# Patient Record
Sex: Male | Born: 1955 | Race: Black or African American | Hispanic: No | Marital: Single | Smoking: Current every day smoker
Health system: Southern US, Community
[De-identification: ages and names within clinical notes are randomized; demographics above are authoritative.]

## PROBLEM LIST (undated history)

## (undated) DIAGNOSIS — I10 Essential (primary) hypertension: Secondary | ICD-10-CM

## (undated) DIAGNOSIS — R569 Unspecified convulsions: Secondary | ICD-10-CM

## (undated) DIAGNOSIS — E119 Type 2 diabetes mellitus without complications: Secondary | ICD-10-CM

## (undated) HISTORY — PX: OTHER SURGICAL HISTORY: SHX169

---

## 2005-02-12 ENCOUNTER — Emergency Department: Payer: Self-pay | Admitting: General Practice

## 2005-06-20 ENCOUNTER — Other Ambulatory Visit: Payer: Self-pay

## 2005-06-20 ENCOUNTER — Emergency Department: Payer: Self-pay | Admitting: Emergency Medicine

## 2006-08-14 ENCOUNTER — Emergency Department: Payer: Self-pay

## 2006-08-14 ENCOUNTER — Other Ambulatory Visit: Payer: Self-pay

## 2007-01-18 ENCOUNTER — Other Ambulatory Visit: Payer: Self-pay

## 2007-01-18 ENCOUNTER — Emergency Department: Payer: Self-pay | Admitting: Emergency Medicine

## 2007-03-06 ENCOUNTER — Emergency Department: Payer: Self-pay | Admitting: Emergency Medicine

## 2007-03-10 ENCOUNTER — Ambulatory Visit: Payer: Self-pay | Admitting: Internal Medicine

## 2007-05-07 ENCOUNTER — Ambulatory Visit: Payer: Self-pay | Admitting: Psychiatry

## 2008-03-14 ENCOUNTER — Emergency Department: Payer: Self-pay | Admitting: Unknown Physician Specialty

## 2008-03-14 ENCOUNTER — Other Ambulatory Visit: Payer: Self-pay

## 2008-06-18 ENCOUNTER — Emergency Department: Payer: Self-pay | Admitting: Emergency Medicine

## 2008-06-18 ENCOUNTER — Other Ambulatory Visit: Payer: Self-pay

## 2009-08-19 ENCOUNTER — Emergency Department: Payer: Self-pay | Admitting: Emergency Medicine

## 2010-04-27 IMAGING — CT CT HEAD WITHOUT CONTRAST
2 series · 16 of 30 positions shown, 20 images · non-contrast
Comparison: none

REASON FOR EXAM: confusion
COMMENTS:

[Series 2: without · axial · non-contrast · 0.40mm/px · z∈[+710,+840]mm · 13 of 32 slices shown, 17 images]
[im 3/32  brain]
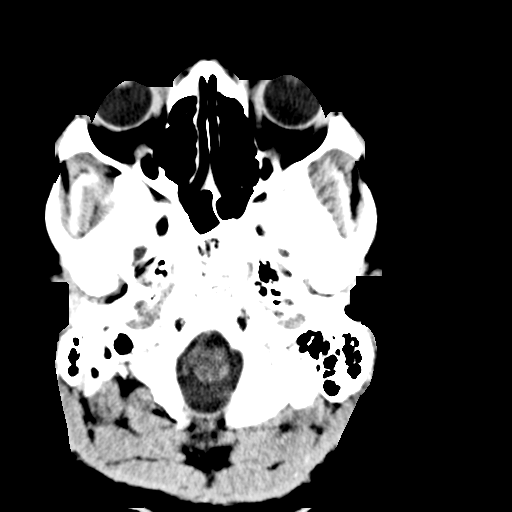
[im 3/32  bone]
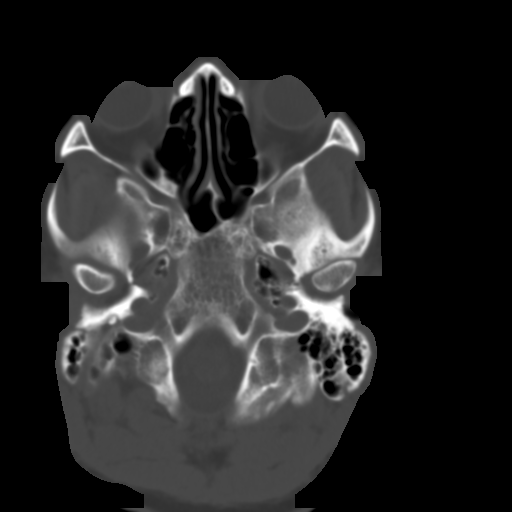
[im 5/32  brain]
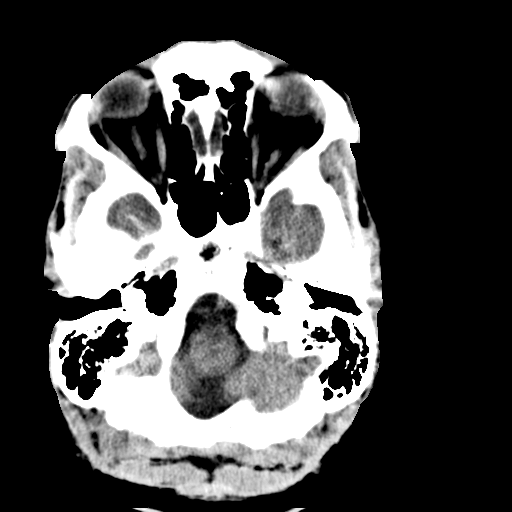
[im 7/32  brain]
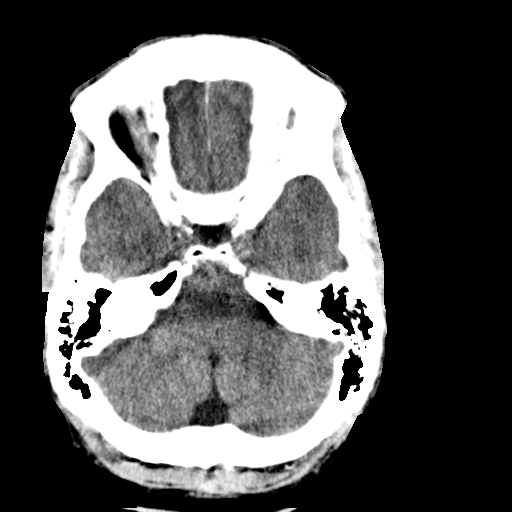
[im 9/32  brain]
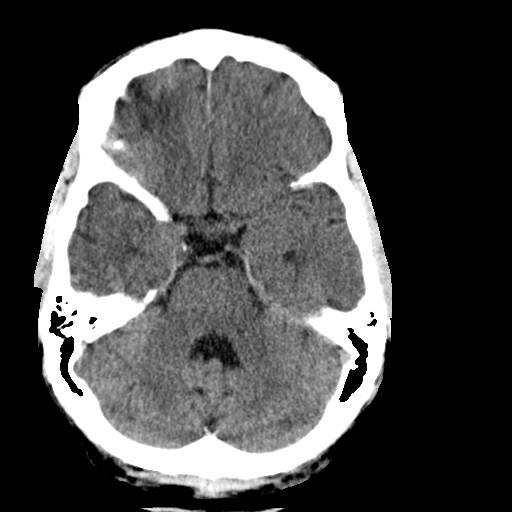
[im 12/32  brain]
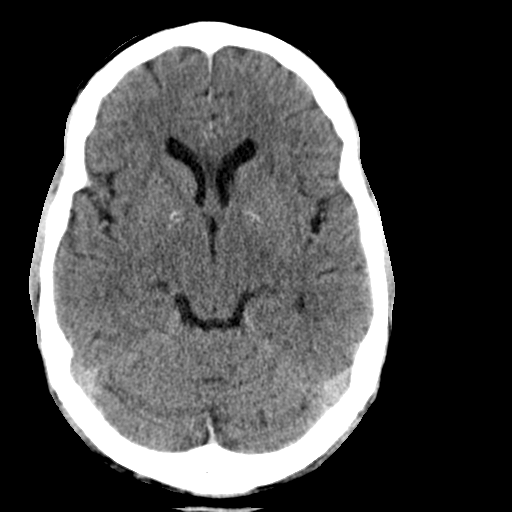
[im 12/32  bone]
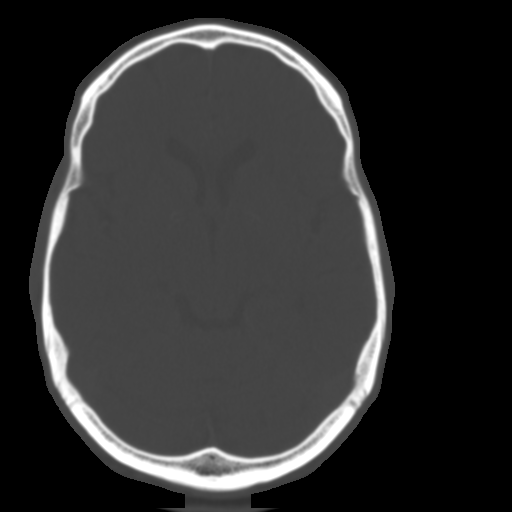
[im 14/32  brain]
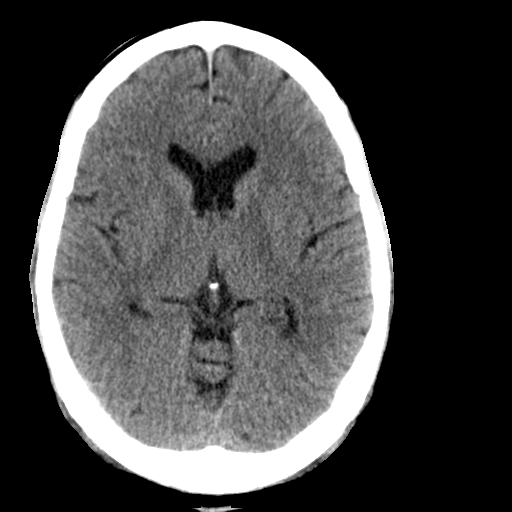
[im 16/32  brain]
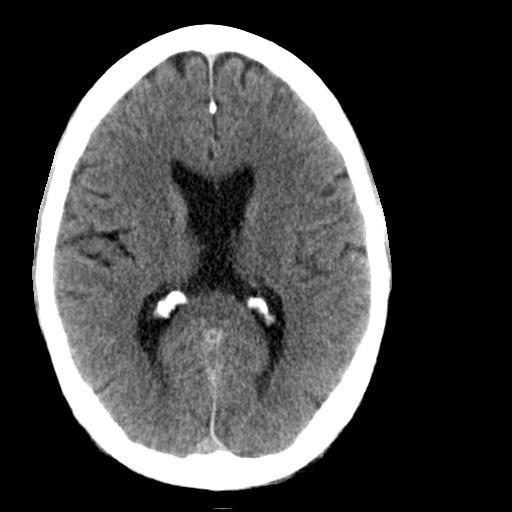
[im 18/32  brain]
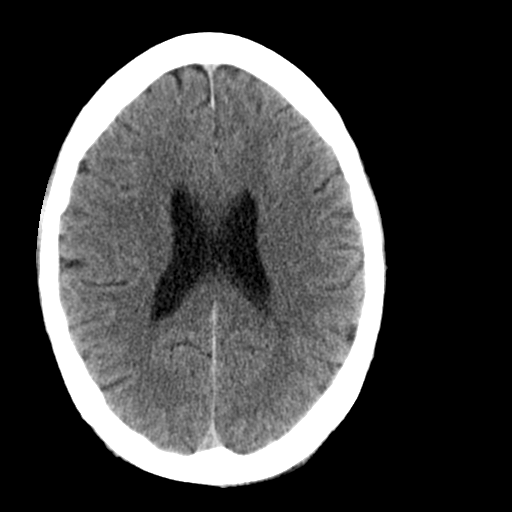
[im 20/32  brain]
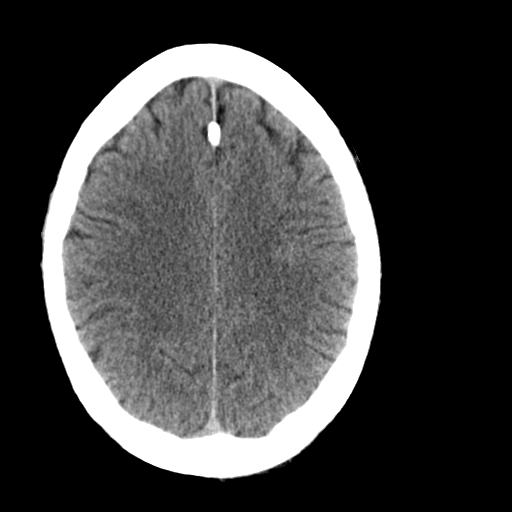
[im 20/32  bone]
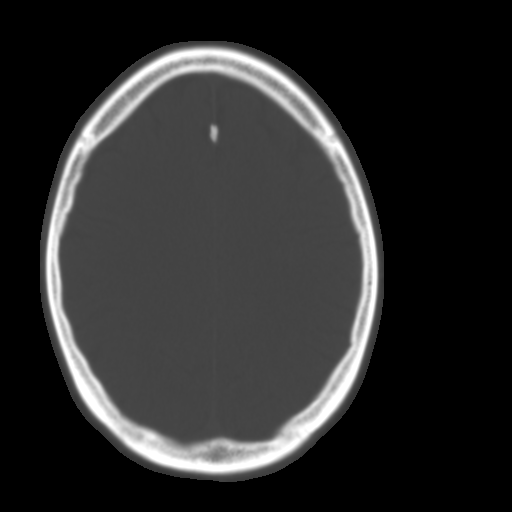
[im 23/32  brain]
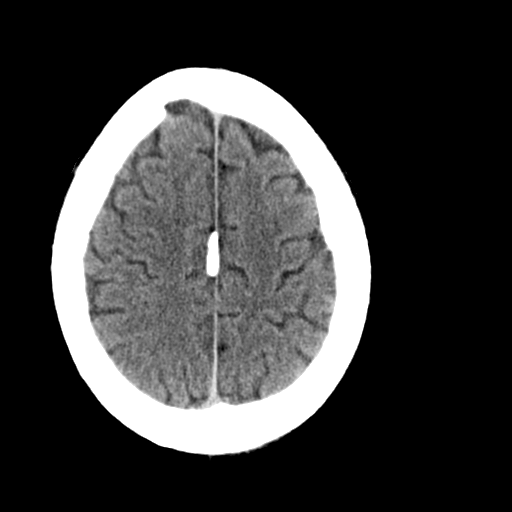
[im 25/32  brain]
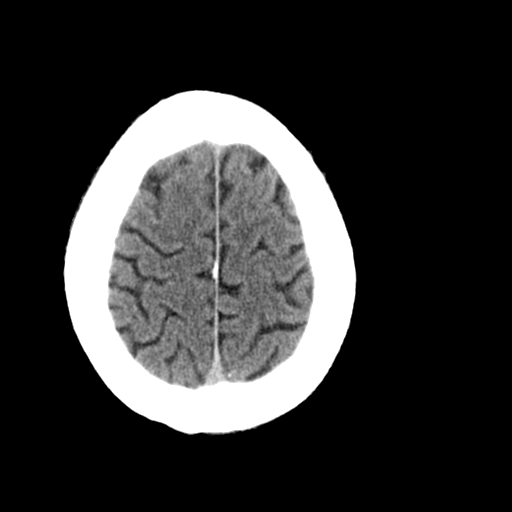
[im 27/32  brain]
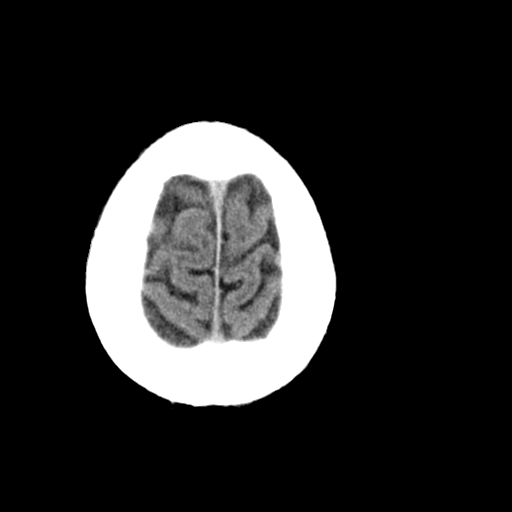
[im 29/32  brain]
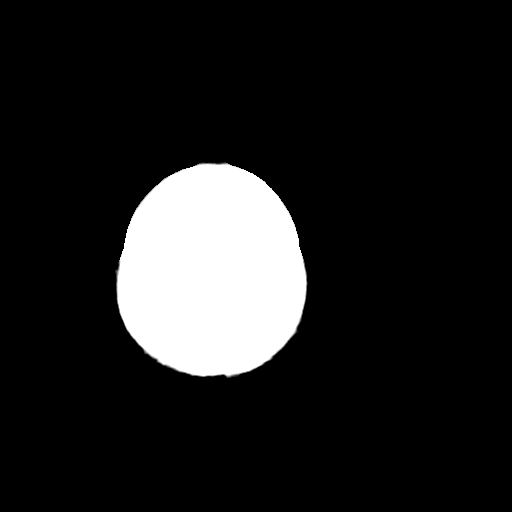
[im 29/32  bone]
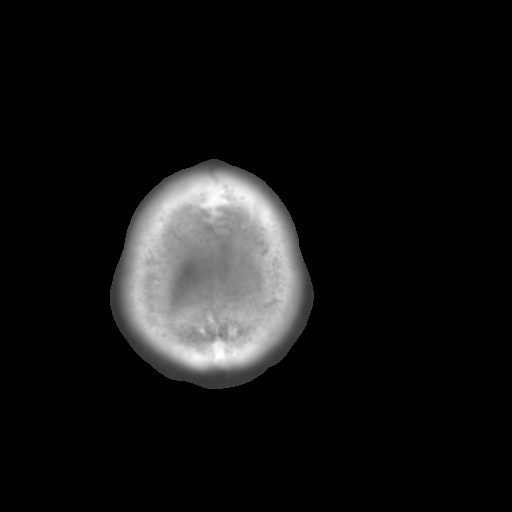

[Series 3: bone · axial · 0.40mm/px · z∈[+710,+756]mm · 3 of 32 slices shown]
[im 3/32  bone]
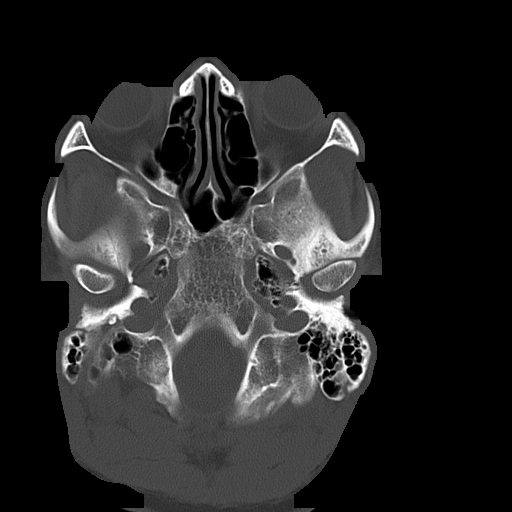
[im 7/32  bone]
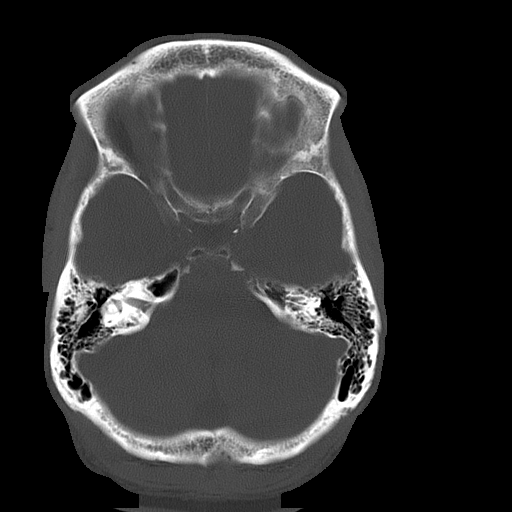
[im 12/32  bone]
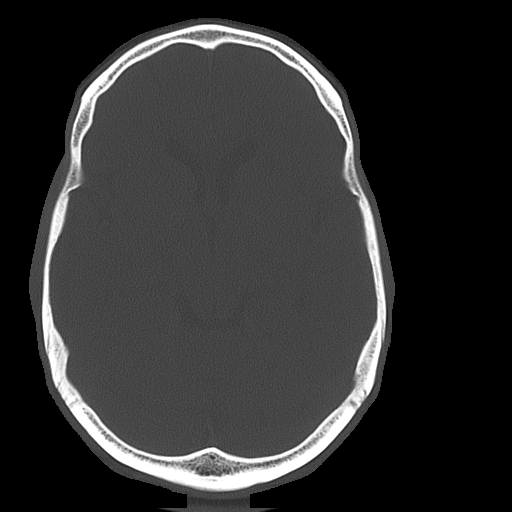

[16 of 30 positions shown; findings below may reference images not displayed]

PROCEDURE:     CT  - CT HEAD WITHOUT CONTRAST  - August 19, 2009  [DATE]

RESULT:     Axial noncontrast CT scanning was performed through the brain at
5 mm intervals and slice thicknesses. Comparison made to study 14 August, 2006.

The ventricles are normal in size and position. There is no intracranial
hemorrhage nor intracranial mass effect. There is faint basal ganglia
calcification bilaterally which is stable. There is falcine calcification
present which is a normal finding. At bone window settings the observed
portions of the paranasal sinuses and mastoid air cells are clear. There is
no evidence of an acute skull fracture.
IMPRESSION: I see no acute intracranial abnormality.

## 2010-05-30 ENCOUNTER — Emergency Department: Payer: Self-pay | Admitting: Emergency Medicine

## 2010-12-29 ENCOUNTER — Other Ambulatory Visit: Payer: Self-pay | Admitting: Family Medicine

## 2011-01-01 ENCOUNTER — Other Ambulatory Visit: Payer: Self-pay | Admitting: Family Medicine

## 2011-01-03 ENCOUNTER — Other Ambulatory Visit: Payer: Self-pay | Admitting: Family Medicine

## 2011-01-05 ENCOUNTER — Other Ambulatory Visit: Payer: Self-pay | Admitting: Family Medicine

## 2014-02-20 DIAGNOSIS — R600 Localized edema: Secondary | ICD-10-CM

## 2014-02-20 DIAGNOSIS — R609 Edema, unspecified: Secondary | ICD-10-CM

## 2014-02-20 DIAGNOSIS — E785 Hyperlipidemia, unspecified: Secondary | ICD-10-CM

## 2014-02-20 DIAGNOSIS — I10 Essential (primary) hypertension: Secondary | ICD-10-CM | POA: Insufficient documentation

## 2014-02-20 HISTORY — DX: Localized edema: R60.0

## 2014-02-20 HISTORY — DX: Edema, unspecified: R60.9

## 2014-02-20 HISTORY — DX: Hyperlipidemia, unspecified: E78.5

## 2014-02-21 ENCOUNTER — Emergency Department: Payer: Self-pay | Admitting: Emergency Medicine

## 2014-02-22 DIAGNOSIS — R748 Abnormal levels of other serum enzymes: Secondary | ICD-10-CM

## 2014-02-22 HISTORY — DX: Abnormal levels of other serum enzymes: R74.8

## 2014-11-10 ENCOUNTER — Ambulatory Visit: Payer: Self-pay | Admitting: Family Medicine

## 2016-12-15 DIAGNOSIS — G40909 Epilepsy, unspecified, not intractable, without status epilepticus: Secondary | ICD-10-CM

## 2016-12-15 HISTORY — DX: Epilepsy, unspecified, not intractable, without status epilepticus: G40.909

## 2017-04-25 DIAGNOSIS — D696 Thrombocytopenia, unspecified: Secondary | ICD-10-CM

## 2017-04-25 DIAGNOSIS — R55 Syncope and collapse: Secondary | ICD-10-CM

## 2017-04-25 HISTORY — DX: Syncope and collapse: R55

## 2017-04-25 HISTORY — DX: Thrombocytopenia, unspecified: D69.6

## 2017-09-18 DIAGNOSIS — C9000 Multiple myeloma not having achieved remission: Secondary | ICD-10-CM

## 2017-09-18 HISTORY — DX: Multiple myeloma not having achieved remission: C90.00

## 2019-04-01 ENCOUNTER — Other Ambulatory Visit: Payer: Self-pay

## 2019-04-01 ENCOUNTER — Emergency Department
Admission: EM | Admit: 2019-04-01 | Discharge: 2019-04-01 | Disposition: A | Discharge status: Home / Self Care | Payer: No Typology Code available for payment source | Attending: Emergency Medicine | Admitting: Emergency Medicine

## 2019-04-01 DIAGNOSIS — F17210 Nicotine dependence, cigarettes, uncomplicated: Secondary | ICD-10-CM | POA: Insufficient documentation

## 2019-04-01 DIAGNOSIS — Z79899 Other long term (current) drug therapy: Secondary | ICD-10-CM | POA: Insufficient documentation

## 2019-04-01 DIAGNOSIS — I10 Essential (primary) hypertension: Secondary | ICD-10-CM | POA: Diagnosis not present

## 2019-04-01 DIAGNOSIS — R569 Unspecified convulsions: Secondary | ICD-10-CM | POA: Insufficient documentation

## 2019-04-01 HISTORY — DX: Essential (primary) hypertension: I10

## 2019-04-01 HISTORY — DX: Unspecified convulsions: R56.9

## 2019-04-01 LAB — CBC WITH DIFFERENTIAL/PLATELET
Abs Immature Granulocytes: 0.02 10*3/uL (ref 0.00–0.07)
Basophils Absolute: 0.1 10*3/uL (ref 0.0–0.1)
Basophils Relative: 1 %
Eosinophils Absolute: 0 10*3/uL (ref 0.0–0.5)
Eosinophils Relative: 0 %
HCT: 41.9 % (ref 39.0–52.0)
Hemoglobin: 14.1 g/dL (ref 13.0–17.0)
Immature Granulocytes: 0 %
Lymphocytes Relative: 29 %
Lymphs Abs: 1.9 10*3/uL (ref 0.7–4.0)
MCH: 31.9 pg (ref 26.0–34.0)
MCHC: 33.7 g/dL (ref 30.0–36.0)
MCV: 94.8 fL (ref 80.0–100.0)
Monocytes Absolute: 0.4 10*3/uL (ref 0.1–1.0)
Monocytes Relative: 6 %
Neutro Abs: 4.2 10*3/uL (ref 1.7–7.7)
Neutrophils Relative %: 64 %
Platelets: 126 10*3/uL — ABNORMAL LOW (ref 150–400)
RBC: 4.42 MIL/uL (ref 4.22–5.81)
RDW: 13.4 % (ref 11.5–15.5)
WBC: 6.8 10*3/uL (ref 4.0–10.5)
nRBC: 0 % (ref 0.0–0.2)

## 2019-04-01 LAB — COMPREHENSIVE METABOLIC PANEL
ALT: 17 U/L (ref 0–44)
AST: 19 U/L (ref 15–41)
Albumin: 4.3 g/dL (ref 3.5–5.0)
Alkaline Phosphatase: 84 U/L (ref 38–126)
Anion gap: 8 (ref 5–15)
BUN: 11 mg/dL (ref 8–23)
CO2: 27 mmol/L (ref 22–32)
Calcium: 9 mg/dL (ref 8.9–10.3)
Chloride: 106 mmol/L (ref 98–111)
Creatinine, Ser: 1.06 mg/dL (ref 0.61–1.24)
GFR calc Af Amer: >60 mL/min (ref >60)
GFR calc non Af Amer: >60 mL/min (ref >60)
Glucose, Bld: 106 mg/dL — ABNORMAL HIGH (ref 70–99)
Potassium: 4.3 mmol/L (ref 3.5–5.1)
Sodium: 141 mmol/L (ref 135–145)
Total Bilirubin: 1 mg/dL (ref 0.3–1.2)
Total Protein: 7.2 g/dL (ref 6.5–8.1)

## 2019-04-01 LAB — GLUCOSE, CAPILLARY: Glucose-Capillary: 95 mg/dL (ref 70–99)

## 2019-04-01 NOTE — ED Triage Notes (Signed)
Pt arrives via EMS after having a seizure at the gas station- pt is epileptic and reports not having a seizure in a while- pt is A&O at this time

## 2019-04-01 NOTE — ED Notes (Signed)
Gave the pt's brother an update and let him know he is up for discharge

## 2019-04-01 NOTE — Discharge Instructions (Addendum)
Continue taking your medications and follow up with your neurologist within the next week.  In the meantime, you should avoid driving, swimming, baths, or other potentially dangerous activities.

## 2019-04-01 NOTE — ED Notes (Signed)
Called pt's brother to let him know pt was leaving

## 2019-04-01 NOTE — ED Notes (Signed)
Attempted to call brother twice and was forwarded to voicemail both times

## 2019-04-01 NOTE — ED Provider Notes (Signed)
[facility] Emergency Department Provider Note  ____________________________________________  Time seen: Approximately 4:08 PM  I have reviewed the triage vital signs and the nursing notes.   HISTORY  Chief Complaint Seizures    HPI Steven Ortiz is a 63 y.o. male with a history of hypertension and epilepsy who reports being in his usual state of health, standing in line at a gas station, when he suddenly had a seizure.  Bystanders reported to EMS that he had convulsive activity for less than a minute, and then was confused for about 5 minutes before returning to normal.  No significant head trauma.  Patient denies any symptoms currently. He reports some difficulty sleeping recently but otherwise eating and drinking normally, no trauma or recent illness.  He has been compliant with his Keppra that he takes twice a day.  He sees Dr. Rona Ravens of [facility] neurology.      Past Medical History:  Diagnosis Date  . Hypertension   . Seizures ([place])      There are no active problems to display for this patient.    History reviewed. No pertinent surgical history.   Prior to Admission medications   Medication Sig Start Date End Date Taking? Authorizing Provider  amLODipine (NORVASC) 10 MG tablet Take 10 mg by mouth daily. 07/01/18 07/01/19 Yes [provider]  aspirin EC 81 MG tablet Take 81 mg by mouth daily. 01/03/18  Yes [provider]  atorvastatin (LIPITOR) 10 MG tablet Take 10 mg by mouth daily. 12/11/18  Yes [provider]  levETIRAcetam (KEPPRA) 500 MG tablet TAKE 3 TABLETS (1,500 MG TOTAL) BY MOUTH 2 (TWO) TIMES DAILY 12/13/18  Yes [provider]     Allergies Patient has no allergy information on record.   History reviewed. No pertinent family history.  Social History Social History   Tobacco Use  . Smoking status: Current Every Day Smoker    Packs/day: 1.00  . Smokeless tobacco: Never Used  Substance Use Topics   . Alcohol use: Not Currently  . Drug use: Not Currently    Review of Systems  Constitutional:   No fever or chills.  ENT:   No sore throat. No rhinorrhea. Cardiovascular:   No chest pain or syncope. Respiratory:   No dyspnea or cough. Gastrointestinal:   Negative for abdominal pain, vomiting and diarrhea.  Musculoskeletal:   Negative for focal pain or swelling All other systems reviewed and are negative except as documented above in ROS and HPI.  ____________________________________________   PHYSICAL EXAM:  VITAL SIGNS: ED Triage Vitals  Enc Vitals Group     BP 04/01/19 1526 (!) 150/86     Pulse Rate 04/01/19 1534 88     Resp 04/01/19 1534 11     Temp 04/01/19 1535 98.5 F (36.9 C)     Temp Source 04/01/19 1535 Oral     SpO2 04/01/19 1534 98 %     Weight 04/01/19 1526 162 lb (73.5 kg)     Height 04/01/19 1526 5\' 9"  (1.753 m)     Head Circumference --      Peak Flow --      Pain Score 04/01/19 1526 0     Pain Loc --      Pain Edu? --      Excl. in [place]? --     Vital signs reviewed, nursing assessments reviewed.   Constitutional:   Alert and oriented. Non-toxic appearance. Eyes:   Conjunctivae are normal. EOMI. PERRL. ENT  Head:   Normocephalic and atraumatic.      Nose:   No congestion/rhinnorhea.       Mouth/Throat:   MMM, no pharyngeal erythema. No peritonsillar mass.       Neck:   No meningismus. Full ROM. Hematological/Lymphatic/Immunilogical:   No cervical lymphadenopathy. Cardiovascular:   RRR. Symmetric bilateral radial and DP pulses.  No murmurs. Cap refill less than 2 seconds. Respiratory:   Normal respiratory effort without tachypnea/retractions. Breath sounds are clear and equal bilaterally. No wheezes/rales/rhonchi. Gastrointestinal:   Soft and nontender. Non distended. There is no CVA tenderness.  No rebound, rigidity, or guarding. Genitourinary:   deferred Musculoskeletal:   Normal range of motion in all extremities. No joint effusions.  No  lower extremity tenderness.  No edema. Neurologic:   Normal speech and language.  Motor grossly intact. No acute focal neurologic deficits are appreciated.  Skin:    Skin is warm, dry and intact. No rash noted.  No petechiae, purpura, or bullae.  ____________________________________________    LABS (pertinent positives/negatives) (all labs ordered are listed, but only abnormal results are displayed) Labs Reviewed  COMPREHENSIVE METABOLIC PANEL - Abnormal; Notable for the following components:      Result Value   Glucose, Bld 106 (*)    All other components within normal limits  CBC WITH DIFFERENTIAL/PLATELET - Abnormal; Notable for the following components:   Platelets 126 (*)    All other components within normal limits  GLUCOSE, CAPILLARY  CBG MONITORING, ED   ____________________________________________   EKG    ____________________________________________    RADIOLOGY  No results found.  ____________________________________________   PROCEDURES Procedures  ____________________________________________    CLINICAL IMPRESSION / ASSESSMENT AND PLAN / ED COURSE  Medications ordered in the ED: Medications - No data to display  Pertinent labs & imaging results that were available during my care of the patient were reviewed by me and considered in my medical decision making (see chart for details).  Steven Ortiz was evaluated in Emergency Department on 04/01/2019 for the symptoms described in the history of present illness. He was evaluated in the context of the global COVID-19 pandemic, which necessitated consideration that the patient might be at risk for infection with the SARS-CoV-2 virus that causes COVID-19. Institutional protocols and algorithms that pertain to the evaluation of patients at risk for COVID-19 are in a state of rapid change based on information released by regulatory bodies including the CDC and federal and state organizations. These policies  and algorithms were followed during the patient's care in the ED.   Patient with epilepsy presents with a seizure.  No clear cause but possibly related to sleep deprivation or dehydration.  Vital signs unremarkable, exam unremarkable, neurologically normal.  Will check labs and observe in the ED for a period of time, plan to discharge after if he is not having any further seizure activity with plan to follow-up with his neurologist.   ----------------------------------------- 5:51 PM on 04/01/2019 -----------------------------------------  Labs unremarkable.  No recurrent seizure activity in the ED.  Stable for discharge and follow-up with his neurologist.     ____________________________________________   FINAL CLINICAL IMPRESSION(S) / ED DIAGNOSES    Final diagnoses:  Seizure [facility])     ED Discharge Orders    None      Portions of this note were generated with dragon dictation software. Dictation errors may occur despite best attempts at proofreading.   Carrie Mew, MD 04/01/19 [phone]

## 2019-08-04 ENCOUNTER — Encounter (INDEPENDENT_AMBULATORY_CARE_PROVIDER_SITE_OTHER): Payer: Self-pay

## 2019-08-04 ENCOUNTER — Other Ambulatory Visit: Payer: Self-pay | Admitting: Family Medicine

## 2019-08-04 ENCOUNTER — Other Ambulatory Visit: Payer: Self-pay

## 2019-08-04 ENCOUNTER — Ambulatory Visit
Admission: RE | Admit: 2019-08-04 | Discharge: 2019-08-04 | Disposition: A | Discharge status: Home / Self Care | Payer: Medicare Other | Source: Ambulatory Visit | Attending: Family Medicine | Admitting: Family Medicine

## 2019-08-04 DIAGNOSIS — M7989 Other specified soft tissue disorders: Secondary | ICD-10-CM

## 2020-03-10 ENCOUNTER — Ambulatory Visit (INDEPENDENT_AMBULATORY_CARE_PROVIDER_SITE_OTHER): Payer: Medicare Other | Admitting: Urology

## 2020-03-10 ENCOUNTER — Ambulatory Visit: Payer: Medicare Other | Attending: Internal Medicine

## 2020-03-10 ENCOUNTER — Encounter: Payer: Self-pay | Admitting: Urology

## 2020-03-10 ENCOUNTER — Other Ambulatory Visit: Payer: Self-pay

## 2020-03-10 VITALS — BP 158/77 | HR 96 | Ht 69.0 in | Wt 162.0 lb

## 2020-03-10 DIAGNOSIS — F17200 Nicotine dependence, unspecified, uncomplicated: Secondary | ICD-10-CM

## 2020-03-10 DIAGNOSIS — R972 Elevated prostate specific antigen [PSA]: Secondary | ICD-10-CM

## 2020-03-10 DIAGNOSIS — Z23 Encounter for immunization: Secondary | ICD-10-CM

## 2020-03-10 HISTORY — DX: Nicotine dependence, unspecified, uncomplicated: F17.200

## 2020-03-10 NOTE — Progress Notes (Signed)
   Covid-19 Vaccination Clinic  Name:  Steven Ortiz    MRN: [id number] DOB: [birth year]  03/10/2020  Steven Ortiz was observed post Covid-19 immunization for 15 minutes without incident. He was provided with Vaccine Information Sheet and instruction to access the V-Safe system.   Steven Ortiz was instructed to call 911 with any severe reactions post vaccine: Marland Kitchen Difficulty breathing  . Swelling of face and throat  . A fast heartbeat  . A bad rash all over body  . Dizziness and weakness   Immunizations Administered    Name Date Dose VIS Date Route   Pfizer COVID-19 Vaccine 03/10/2020 11:56 AM 0.3 mL 11/20/2019 Intramuscular   Manufacturer: [place]   Lot: [id number]   [place]: [id number]

## 2020-03-10 NOTE — Patient Instructions (Signed)
Prostate Cancer Screening  Prostate cancer screening is a test that is done to check for the presence of prostate cancer in men. The prostate gland is a walnut-sized gland that is located below the bladder and in front of the rectum in males. The function of the prostate is to add fluid to semen during ejaculation. Prostate cancer is the second most common type of cancer in men. Who should have prostate cancer screening?  Screening recommendations vary based on age and other risk factors. Screening is recommended if:  You are older than age 55. If you are age 55-69, talk with your health care provider about your need for screening and how often screening should be done. Because most prostate cancers are slow growing and will not cause death, screening is generally reserved in this age group for men who have a 10-15-year life expectancy.  You are younger than age 55, and you have these risk factors: ? Being a black male or a male of African descent. ? Having a father, brother, or uncle who has been diagnosed with prostate cancer. The risk is higher if your family member's cancer occurred at an early age. Screening is not recommended if:  You are younger than age 40.  You are between the ages of 40 and 54 and you have no risk factors.  You are 66 years of age or older. At this age, the risks that screening can cause are greater than the benefits that it may provide. If you are at high risk for prostate cancer, your health care provider may recommend that you have screenings more often or that you start screening at a younger age. How is screening for prostate cancer done? The recommended prostate cancer screening test is a blood test called the prostate-specific antigen (PSA) test. PSA is a protein that is made in the prostate. As you age, your prostate naturally produces more PSA. Abnormally high PSA levels may be caused by:  Prostate cancer.  An enlarged prostate that is not caused by cancer  (benign prostatic hyperplasia, BPH). This condition is very common in older men.  A prostate gland infection (prostatitis). Depending on the PSA results, you may need more tests, such as:  A physical exam to check the size of your prostate gland.  Blood and imaging tests.  A procedure to remove tissue samples from your prostate gland for testing (biopsy). What are the benefits of prostate cancer screening?  Screening can help to identify cancer at an early stage, before symptoms start and when the cancer can be treated more easily.  There is a small chance that screening may lower your risk of dying from prostate cancer. The chance is small because prostate cancer is a slow-growing cancer, and most men with prostate cancer die from a different cause. What are the risks of prostate cancer screening? The main risk of prostate cancer screening is diagnosing and treating prostate cancer that would never have caused any symptoms or problems. This is called overdiagnosisand overtreatment. PSA screening cannot tell you if your PSA is high due to cancer or a different cause. A prostate biopsy is the only procedure to diagnose prostate cancer. Even the results of a biopsy may not tell you if your cancer needs to be treated. Slow-growing prostate cancer may not need any treatment other than monitoring, so diagnosing and treating it may cause unnecessary stress or other side effects. A prostate biopsy may also cause:  Infection or fever.  A false negative. This is   a result that shows that you do not have prostate cancer when you actually do have prostate cancer. Questions to ask your health care provider  When should I start prostate cancer screening?  What is my risk for prostate cancer?  How often do I need screening?  What type of screening tests do I need?  How do I get my test results?  What do my results mean?  Do I need treatment? Where to find more information  The American Cancer  Society: www.cancer.org  American Urological Association: www.auanet.org Contact a health care provider if:  You have difficulty urinating.  You have pain when you urinate or ejaculate.  You have blood in your urine or semen.  You have pain in your back or in the area of your prostate. Summary  Prostate cancer is a common type of cancer in men. The prostate gland is located below the bladder and in front of the rectum. This gland adds fluid to semen during ejaculation.  Prostate cancer screening may identify cancer at an early stage, when the cancer can be treated more easily.  The prostate-specific antigen (PSA) test is the recommended screening test for prostate cancer.  Discuss the risks and benefits of prostate cancer screening with your health care provider. If you are age 66 or older, the risks that screening can cause are greater than the benefits that it may provide. This information is not intended to replace advice given to you by your health care provider. Make sure you discuss any questions you have with your health care provider. Document Revised: 07/09/2019 Document Reviewed: 07/09/2019 Elsevier Patient Education  2020 Elsevier Inc.  

## 2020-03-10 NOTE — Progress Notes (Signed)
   03/10/20 10:40 AM   Steven Ortiz [birth year] [id number]  CC: Elevated PSA  HPI: I saw Steven Ortiz in urology clinic today for elevated PSA.  He is a 64 year old African-American male with past medical history notable for epilepsy well-controlled on Keppra with no seizures in the last 3 years.  He denies any family history of prostate cancer, but has a history of ovarian cancer in his mother.  On routine screening he was found to have an elevated PSA of 10.77 on 02/04/2020 which was elevated from 3.23 the year prior.  His baseline has ranged from 2.5-4.  He denies any urinary symptoms or history of UTI.  He has never had a prostate biopsy previously.  He does have some trouble with erections.  He has a 10-pack-year smoking history and is an active smoker.  He denies any prior abdominal surgeries.   PMH: Past Medical History:  Diagnosis Date  . Elevated alkaline phosphatase level 02/22/2014   159  02/2014  . Hyperlipemia 02/20/2014  . Hypertension   . Myeloma ([place]) 09/18/2017  . Peripheral edema 02/20/2014  . Seizure disorder ([place]) 12/15/2016  . Seizures ([place])   . Smoker 03/10/2020  . Syncope 04/25/2017  . Thrombocytopenia ([place]) 04/25/2017    Surgical History: Past Surgical History:  Procedure Laterality Date  . none      Family History: Family History  Problem Relation Age of Onset  . Prostate cancer Neg Hx   . Bladder Cancer Neg Hx   . Kidney cancer Neg Hx     Social History:  reports that he has been smoking. He has been smoking about 1.00 pack per day. He has never used smokeless tobacco. He reports previous alcohol use. He reports previous drug use.  Physical Exam: BP (!) 158/77   Pulse 96   Ht 5\' 9"  (1.753 m)   Wt 162 lb (73.5 kg)   BMI 23.92 kg/m    Constitutional:  Alert and oriented, No acute distress. Cardiovascular: No clubbing, cyanosis, or edema. Respiratory: Normal respiratory effort, no increased work of breathing. GI: Abdomen is soft, nontender,  nondistended, no abdominal masses DRE: 80 g, smooth, no nodules or masses  Laboratory Data: PSA history reviewed, see HPI  Pertinent Imaging: None to review  Assessment & Plan:   In summary, he is a healthy 64 year old African-American male with a single elevated PSA of 10.77, normal DRE, and no family history of prostate cancer.  We reviewed the implications of an elevated PSA and the uncertainty surrounding it. In general, a man's PSA increases with age and is produced by both normal and cancerous prostate tissue. The differential diagnosis for elevated PSA includes BPH, prostate cancer, infection, recent intercourse/ejaculation, recent urethroscopic manipulation (foley placement/cystoscopy) or trauma, and prostatitis.   Management of an elevated PSA can include observation or prostate biopsy and we discussed this in detail. Our goal is to detect clinically significant prostate cancers, and manage with either active surveillance, surgery, or radiation for localized disease. Risks of prostate biopsy include bleeding, infection (including life threatening sepsis), pain, and lower urinary symptoms. Hematuria, hematospermia, and blood in the stool are all common after biopsy and can persist up to 4 weeks.   Repeat PSA today with free/total ratio Pursue prostate biopsy if PSA remains elevated, consider trending if decreased significantly  Nickolas Madrid, MD 03/10/2020  [facility] [address] [place], [place] [address] [phone]

## 2020-03-11 ENCOUNTER — Ambulatory Visit: Payer: No Typology Code available for payment source

## 2020-03-11 LAB — FPSA% REFLEX
% FREE PSA: 20 %
PSA, FREE: 1.34 ng/mL

## 2020-03-11 LAB — PSA TOTAL (REFLEX TO FREE): Prostate Specific Ag, Serum: 6.7 ng/mL — ABNORMAL HIGH (ref 0.0–4.0)

## 2020-03-16 ENCOUNTER — Telehealth: Payer: Self-pay

## 2020-03-16 DIAGNOSIS — R972 Elevated prostate specific antigen [PSA]: Secondary | ICD-10-CM

## 2020-03-16 NOTE — Telephone Encounter (Signed)
-----   Message from Billey Co, MD sent at 03/11/2020  7:25 AM EDT ----- PSA decreased to 6.7 from 10.7, repeat PSA with reflex to free in 4-6 weeks.  Olivia Mackie- please schedule RTC w me in 6 weeks to review PSA results. Virtual OK if he prefers that.  Nickolas Madrid, MD 03/11/2020

## 2020-03-16 NOTE — Telephone Encounter (Signed)
Patient notified and appt made

## 2020-04-05 ENCOUNTER — Ambulatory Visit: Payer: Medicare Other | Attending: Internal Medicine

## 2020-04-05 DIAGNOSIS — Z23 Encounter for immunization: Secondary | ICD-10-CM

## 2020-04-05 NOTE — Progress Notes (Signed)
   Covid-19 Vaccination Clinic  Name:  Steven Ortiz    MRN: [id number] DOB: Oct 01, 1956  04/05/2020  [name] was observed post Covid-19 immunization for 15 minutes without incident. He was provided with Vaccine Information Sheet and instruction to access the V-Safe system.   [name] was instructed to call 911 with any severe reactions post vaccine: Marland Kitchen Difficulty breathing  . Swelling of face and throat  . A fast heartbeat  . A bad rash all over body  . Dizziness and weakness   Immunizations Administered    Name Date Dose VIS Date Route   Pfizer COVID-19 Vaccine 04/05/2020 11:05 AM 0.3 mL 02/03/2019 Intramuscular   Manufacturer: [place]   Lot: [id number]   [place]: [id number]

## 2020-04-11 IMAGING — US RIGHT LOWER EXTREMITY VENOUS ULTRASOUND
1 series · 13 of 24 positions shown · non-contrast
Comparison: 11/10/2014

CLINICAL DATA: Right leg swelling for 2 months.



[Series 1: right lower extremity venous ultrasound · 0.08mm/px · 13 of 34 slices shown]
[im 1/34]
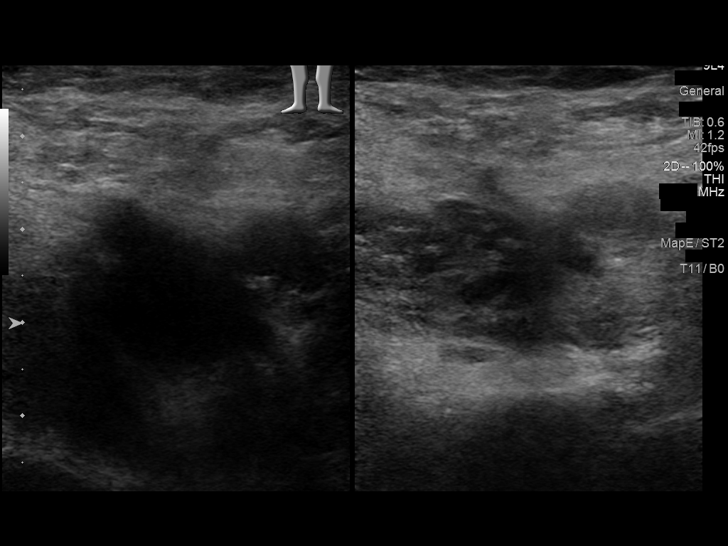
[im 3/34]
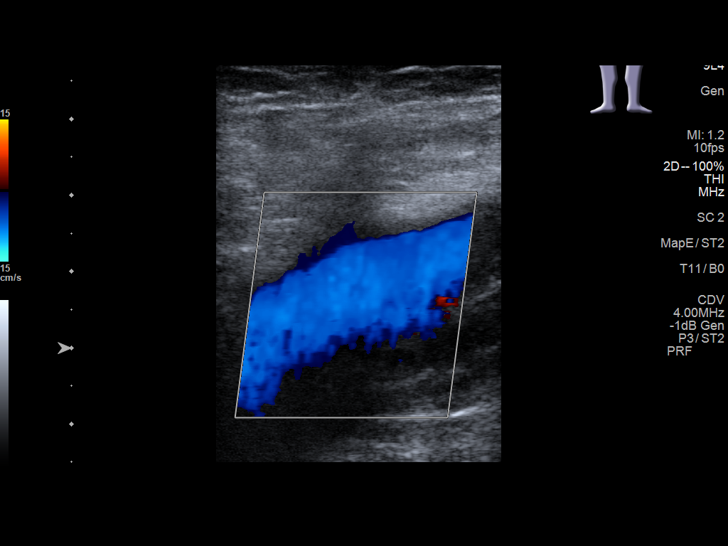
[im 6/34]
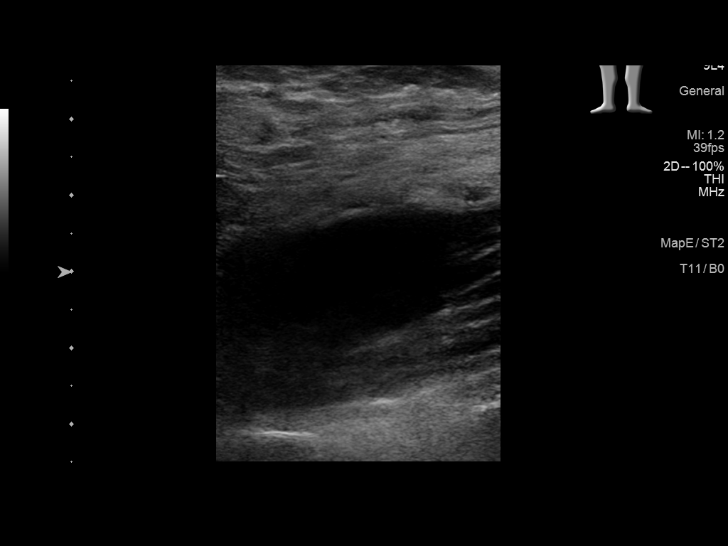
[im 9/34]
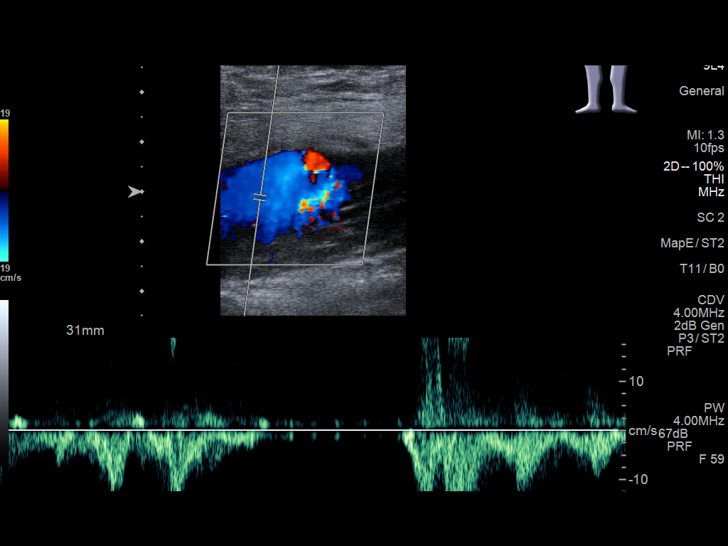
[im 12/34]
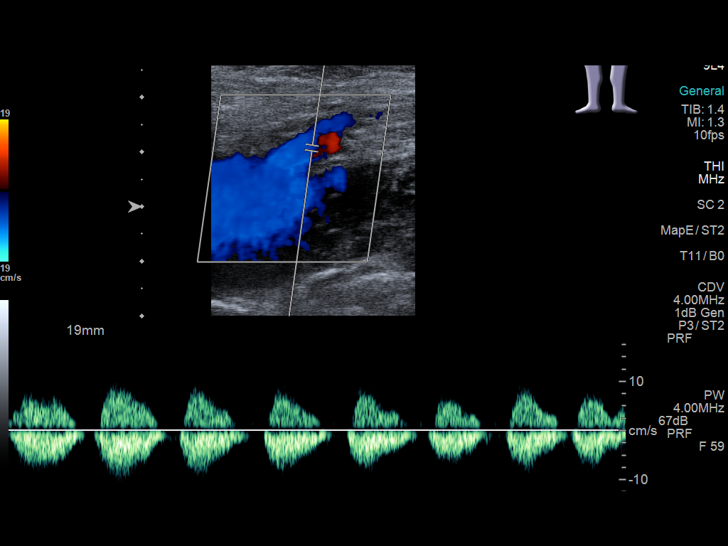
[im 15/34]
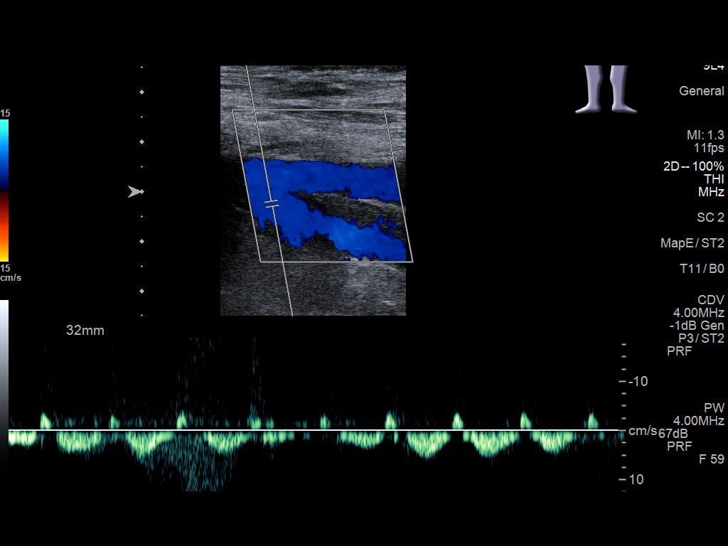
[im 18/34]
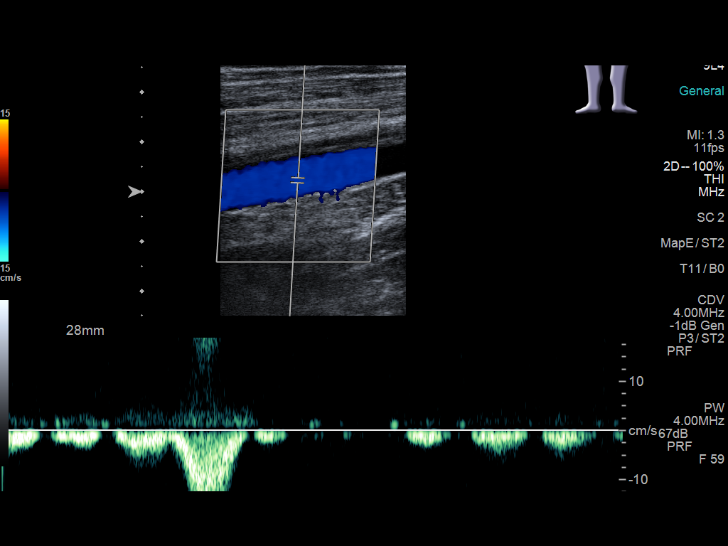
[im 19/34]
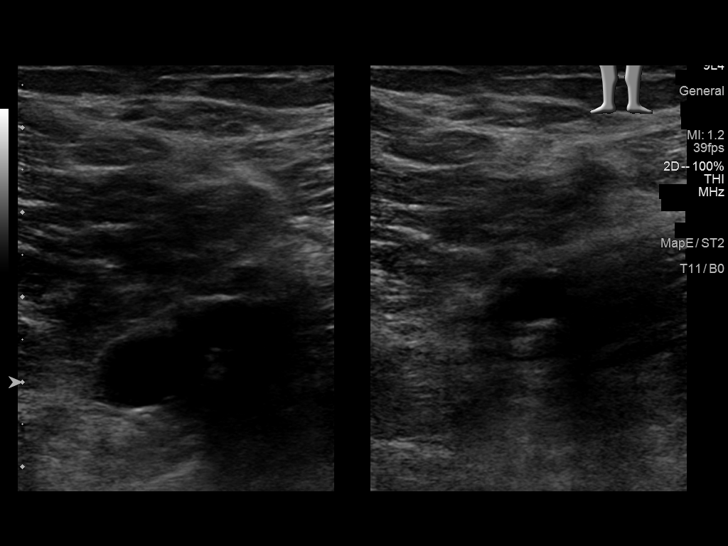
[im 22/34]
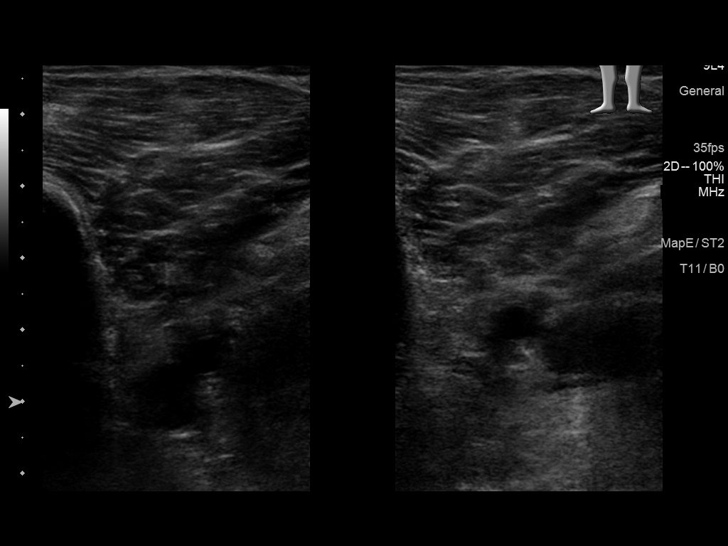
[im 25/34]
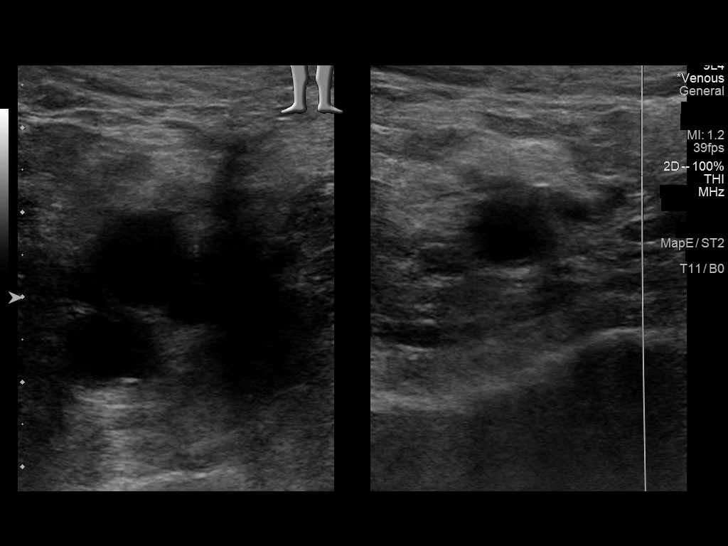
[im 28/34]
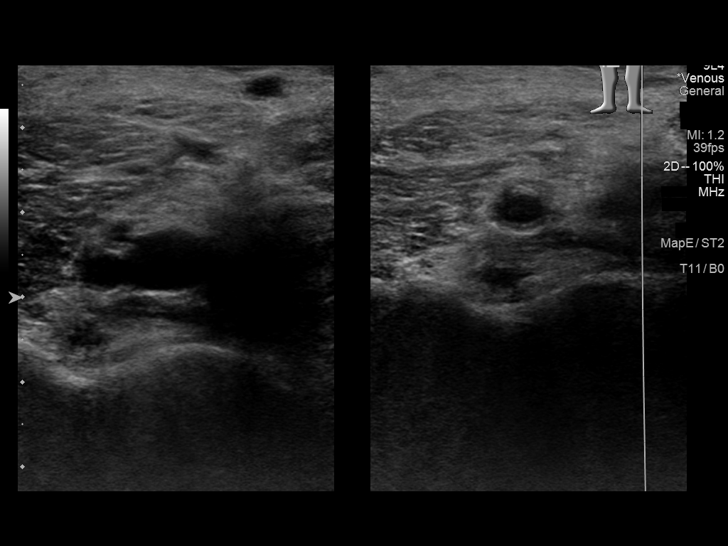
[im 31/34]
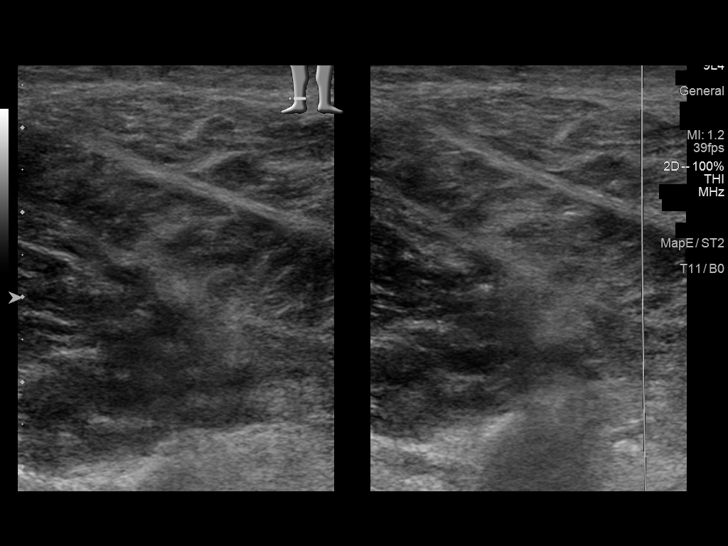
[im 34/34]
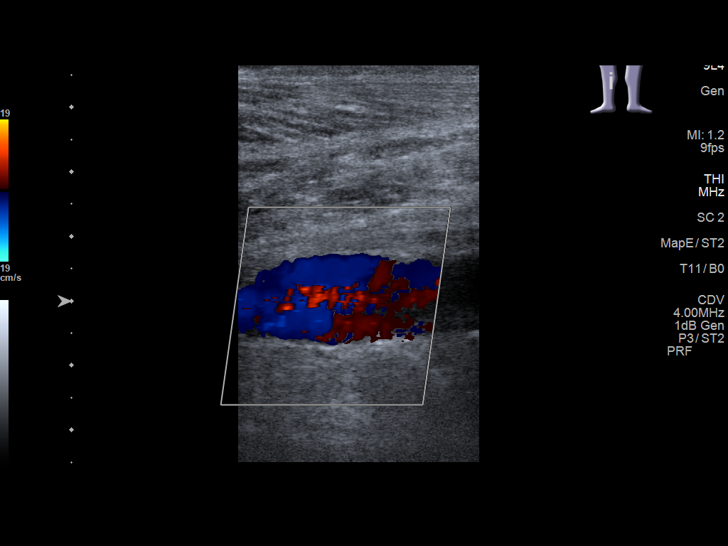

[13 of 24 positions shown; findings below may reference images not displayed]

FINDINGS: Contralateral Common Femoral Vein: Respiratory phasicity is normal
and symmetric with the symptomatic side. No evidence of thrombus.
Normal compressibility.

Common Femoral Vein: No evidence of thrombus. Normal
compressibility, respiratory phasicity and response to augmentation.

Saphenofemoral Junction: No evidence of thrombus. Normal
compressibility and flow on color Doppler imaging.

Profunda Femoral Vein: No evidence of thrombus. Normal
compressibility and flow on color Doppler imaging.

Femoral Vein: No evidence of thrombus. Normal compressibility,
respiratory phasicity and response to augmentation.

Popliteal Vein: No evidence of thrombus. Normal compressibility,
respiratory phasicity and response to augmentation.

Calf Veins: No evidence of thrombus. Normal compressibility and flow
on color Doppler imaging.

Other Findings:  None.
IMPRESSION: Negative for deep venous thrombosis in right lower extremity.

## 2020-04-18 ENCOUNTER — Other Ambulatory Visit: Payer: Medicare Other

## 2020-04-18 ENCOUNTER — Other Ambulatory Visit: Payer: Self-pay

## 2020-04-18 DIAGNOSIS — R972 Elevated prostate specific antigen [PSA]: Secondary | ICD-10-CM

## 2020-04-19 ENCOUNTER — Telehealth: Payer: Self-pay

## 2020-04-19 DIAGNOSIS — R972 Elevated prostate specific antigen [PSA]: Secondary | ICD-10-CM

## 2020-04-19 LAB — PSA TOTAL (REFLEX TO FREE): Prostate Specific Ag, Serum: 3.6 ng/mL (ref 0.0–4.0)

## 2020-04-19 NOTE — Telephone Encounter (Signed)
-----   Message from Billey Co, MD sent at 04/19/2020  9:59 AM EDT ----- Doristine Devoid news, PSA back down to normal at 3.6, no need for biopsy.   Olivia Mackie, please schedule RTC 1 year with PSA reflex to free prior, thanks  Nickolas Madrid, MD 04/19/2020

## 2020-04-19 NOTE — Telephone Encounter (Signed)
Left pt message to call °

## 2020-04-26 NOTE — Telephone Encounter (Signed)
Patient notified and scheduled for 1year follow up , PSA order in

## 2020-06-16 ENCOUNTER — Emergency Department
Admission: EM | Admit: 2020-06-16 | Discharge: 2020-06-16 | Disposition: A | Discharge status: Home / Self Care | Payer: Medicare Other | Attending: Emergency Medicine | Admitting: Emergency Medicine

## 2020-06-16 ENCOUNTER — Other Ambulatory Visit: Payer: Self-pay

## 2020-06-16 ENCOUNTER — Encounter: Payer: Self-pay | Admitting: Emergency Medicine

## 2020-06-16 DIAGNOSIS — G40909 Epilepsy, unspecified, not intractable, without status epilepticus: Secondary | ICD-10-CM

## 2020-06-16 DIAGNOSIS — Z79899 Other long term (current) drug therapy: Secondary | ICD-10-CM | POA: Diagnosis not present

## 2020-06-16 DIAGNOSIS — R410 Disorientation, unspecified: Secondary | ICD-10-CM | POA: Diagnosis not present

## 2020-06-16 DIAGNOSIS — I10 Essential (primary) hypertension: Secondary | ICD-10-CM | POA: Insufficient documentation

## 2020-06-16 DIAGNOSIS — R569 Unspecified convulsions: Secondary | ICD-10-CM | POA: Diagnosis present

## 2020-06-16 DIAGNOSIS — F17200 Nicotine dependence, unspecified, uncomplicated: Secondary | ICD-10-CM | POA: Insufficient documentation

## 2020-06-16 LAB — COMPREHENSIVE METABOLIC PANEL
ALT: 16 U/L (ref 0–44)
AST: 18 U/L (ref 15–41)
Albumin: 4.5 g/dL (ref 3.5–5.0)
Alkaline Phosphatase: 76 U/L (ref 38–126)
Anion gap: 8 (ref 5–15)
BUN: 13 mg/dL (ref 8–23)
CO2: 27 mmol/L (ref 22–32)
Calcium: 9.4 mg/dL (ref 8.9–10.3)
Chloride: 107 mmol/L (ref 98–111)
Creatinine, Ser: 1 mg/dL (ref 0.61–1.24)
GFR calc Af Amer: >60 mL/min (ref >60)
GFR calc non Af Amer: >60 mL/min (ref >60)
Glucose, Bld: 86 mg/dL (ref 70–99)
Potassium: 4.1 mmol/L (ref 3.5–5.1)
Sodium: 142 mmol/L (ref 135–145)
Total Bilirubin: 0.7 mg/dL (ref 0.3–1.2)
Total Protein: 7.4 g/dL (ref 6.5–8.1)

## 2020-06-16 LAB — CBC
HCT: 40.9 % (ref 39.0–52.0)
Hemoglobin: 14.3 g/dL (ref 13.0–17.0)
MCH: 32.2 pg (ref 26.0–34.0)
MCHC: 35 g/dL (ref 30.0–36.0)
MCV: 92.1 fL (ref 80.0–100.0)
Platelets: 135 10*3/uL — ABNORMAL LOW (ref 150–400)
RBC: 4.44 MIL/uL (ref 4.22–5.81)
RDW: 13.3 % (ref 11.5–15.5)
WBC: 8.6 10*3/uL (ref 4.0–10.5)
nRBC: 0 % (ref 0.0–0.2)

## 2020-06-16 MED ORDER — SODIUM CHLORIDE 0.9 % IV BOLUS: 1000.0000 mL | Freq: Once | INTRAVENOUS | Status: DC

## 2020-06-16 NOTE — Discharge Instructions (Addendum)
Continue taking all home medications, and increase water intake to stay hydrated. Please follow up with your neurologist in the next week.

## 2020-06-16 NOTE — ED Provider Notes (Signed)
[facility] Emergency Department Provider Note  ____________________________________________  Time seen: Approximately 6:03 PM  I have reviewed the triage vital signs and the nursing notes.   HISTORY  Chief Complaint Seizures    HPI Vernor Monnig is a 64 y.o. male with a history of hypertension and seizure disorder who is brought to the ED after an episode of confusion at work today.  Patient reports being in his usual state of health without any acute symptoms, when he was noted at work to appear confused.  This lasted for about 30 minutes after which the patient returned to normal.  Denies any headache vision change paresthesias or weakness, no trauma, no fevers chills neck stiffness or other recent illness.  Reports compliance with his medications.  Patient also notes that he does not drink much fluids, and yesterday spent a long time out in hot weather running errands.  Denies orthostatic symptoms      Past Medical History:  Diagnosis Date  . Elevated alkaline phosphatase level 02/22/2014   159  02/2014  . Hyperlipemia 02/20/2014  . Hypertension   . Myeloma ([place]) 09/18/2017  . Peripheral edema 02/20/2014  . Seizure disorder ([place]) 12/15/2016  . Seizures ([place])   . Smoker 03/10/2020  . Syncope 04/25/2017  . Thrombocytopenia ([place]) 04/25/2017     Patient Active Problem List   Diagnosis Date Noted  . Smoker 03/10/2020  . Myeloma ([place]) 09/18/2017  . Syncope 04/25/2017  . Thrombocytopenia ([place]) 04/25/2017  . Seizure disorder ([place]) 12/15/2016  . Elevated alkaline phosphatase level 02/22/2014  . Hyperlipemia 02/20/2014  . Hypertension 02/20/2014  . Peripheral edema 02/20/2014     Past Surgical History:  Procedure Laterality Date  . none       Prior to Admission medications   Medication Sig Start Date End Date Taking? Authorizing Provider  amLODipine (NORVASC) 10 MG tablet Take 10 mg by mouth daily. 07/01/18 07/01/19  [provider]   aspirin EC 81 MG tablet Take 81 mg by mouth daily. 01/03/18   [provider]  atorvastatin (LIPITOR) 10 MG tablet Take 10 mg by mouth daily. 12/11/18   [provider]  levETIRAcetam (KEPPRA) 500 MG tablet TAKE 3 TABLETS (1,500 MG TOTAL) BY MOUTH 2 (TWO) TIMES DAILY 12/13/18   [provider]     Allergies Patient has no known allergies.   Family History  Problem Relation Age of Onset  . Prostate cancer Neg Hx   . Bladder Cancer Neg Hx   . Kidney cancer Neg Hx     Social History Social History   Tobacco Use  . Smoking status: Current Every Day Smoker    Packs/day: 1.00  . Smokeless tobacco: Never Used  Vaping Use  . Vaping Use: Never used  Substance Use Topics  . Alcohol use: Not Currently  . Drug use: Not Currently    Review of Systems  Constitutional:   No fever or chills.  ENT:   No sore throat. No rhinorrhea. Cardiovascular:   No chest pain or syncope. Respiratory:   No dyspnea or cough. Gastrointestinal:   Negative for abdominal pain, vomiting and diarrhea.  Musculoskeletal:   Negative for focal pain or swelling All other systems reviewed and are negative except as documented above in ROS and HPI.  ____________________________________________   PHYSICAL EXAM:  VITAL SIGNS: ED Triage Vitals  Enc Vitals Group     BP 06/16/20 1315 126/72     Pulse Rate 06/16/20 1315 82  Resp 06/16/20 1315 16     Temp 06/16/20 1315 98.5 F (36.9 C)     Temp Source 06/16/20 1315 Oral     SpO2 06/16/20 1315 99 %     Weight 06/16/20 1313 162 lb 0.6 oz (73.5 kg)     Height 06/16/20 1313 5\' 9"  (1.753 m)     Head Circumference --      Peak Flow --      Pain Score 06/16/20 1313 0     Pain Loc --      Pain Edu? --      Excl. in [place]? --     Vital signs reviewed, nursing assessments reviewed.   Constitutional:   Alert and oriented. Non-toxic appearance. Eyes:   Conjunctivae are normal. EOMI. PERRL.  No nystagmus ENT      Head:   Normocephalic  and atraumatic.      Nose:   Normal      Mouth/Throat:   Moist mucous membranes.      Neck:   No meningismus. Full ROM. Hematological/Lymphatic/Immunilogical:   No cervical lymphadenopathy. Cardiovascular:   RRR. Symmetric bilateral radial and DP pulses.  No murmurs. Cap refill less than 2 seconds. Respiratory:   Normal respiratory effort without tachypnea/retractions. Breath sounds are clear and equal bilaterally. No wheezes/rales/rhonchi. Gastrointestinal:   Soft and nontender. Non distended. There is no CVA tenderness.  No rebound, rigidity, or guarding.  Musculoskeletal:   Normal range of motion in all extremities. No joint effusions.  No lower extremity tenderness.  No edema. Neurologic:   Normal speech and language.  Motor grossly intact. No acute focal neurologic deficits are appreciated.  Skin:    Skin is warm, dry and intact. No rash noted.  No petechiae, purpura, or bullae.  ____________________________________________    LABS (pertinent positives/negatives) (all labs ordered are listed, but only abnormal results are displayed) Labs Reviewed  CBC - Abnormal; Notable for the following components:      Result Value   Platelets 135 (*)    All other components within normal limits  COMPREHENSIVE METABOLIC PANEL   ____________________________________________   EKG    ____________________________________________    RADIOLOGY  No results found.  ____________________________________________   PROCEDURES Procedures  ____________________________________________  DIFFERENTIAL DIAGNOSIS   Seizure, dehydration, electrolyte undermining, fatigue  CLINICAL IMPRESSION / ASSESSMENT AND PLAN / ED COURSE  Medications ordered in the ED: Medications - No data to display  Pertinent labs & imaging results that were available during my care of the patient were reviewed by me and considered in my medical decision making (see chart for details).  Flem Enderle was  evaluated in Emergency Department on 06/16/2020 for the symptoms described in the history of present illness. He was evaluated in the context of the global COVID-19 pandemic, which necessitated consideration that the patient might be at risk for infection with the SARS-CoV-2 virus that causes COVID-19. Institutional protocols and algorithms that pertain to the evaluation of patients at risk for COVID-19 are in a state of rapid change based on information released by regulatory bodies including the CDC and federal and state organizations. These policies and algorithms were followed during the patient's care in the ED.   Patient presents with an episode of confusion that happened at work, lasting about 30 minutes.  No acute neurologic symptoms, returned to baseline quickly.  No fall or trauma.  No other symptoms to suggest illness.  He currently is symptom-free, vital signs are normal, no recurrent episodes since this 1  which occurred at about 10 AM today.  Possible this was seizure related although more likely related to dehydration due to hot weather and poor fluid intake.  Counseled the patient on increasing fluids, offered IV fluids which he refuses and feels that he can adequately hydrate orally.  He is comfortable with discharge at this time, will continue his home medications and call his neurologist for follow-up.      ____________________________________________   FINAL CLINICAL IMPRESSION(S) / ED DIAGNOSES    Final diagnoses:  Episode of confusion  Seizure disorder [facility])     ED Discharge Orders    None      Portions of this note were generated with dragon dictation software. Dictation errors may occur despite best attempts at proofreading.   Carrie Mew, MD 06/16/20 1807

## 2020-06-16 NOTE — ED Notes (Signed)
Lt green, red, and lavender tubes sent to lab.

## 2020-06-16 NOTE — ED Triage Notes (Signed)
States while at work had a possible seizure. EMS reports that patient had suddenly become confused while working.  Initially patient was confused for EMS, but upon arrival to ED, patient AAOx3.  NAD.  States last seizure was over 2 years ago.  Reports that this could have been a seizure.

## 2021-04-18 ENCOUNTER — Encounter: Payer: Self-pay | Admitting: *Deleted

## 2021-04-18 ENCOUNTER — Other Ambulatory Visit: Payer: Self-pay

## 2021-04-18 ENCOUNTER — Emergency Department
Admission: EM | Admit: 2021-04-18 | Discharge: 2021-04-19 | Disposition: A | Discharge status: Home / Self Care | Payer: Medicare HMO | Attending: Emergency Medicine | Admitting: Emergency Medicine

## 2021-04-18 DIAGNOSIS — F17200 Nicotine dependence, unspecified, uncomplicated: Secondary | ICD-10-CM | POA: Insufficient documentation

## 2021-04-18 DIAGNOSIS — Z79899 Other long term (current) drug therapy: Secondary | ICD-10-CM | POA: Diagnosis not present

## 2021-04-18 DIAGNOSIS — I10 Essential (primary) hypertension: Secondary | ICD-10-CM | POA: Insufficient documentation

## 2021-04-18 DIAGNOSIS — Y92410 Unspecified street and highway as the place of occurrence of the external cause: Secondary | ICD-10-CM | POA: Insufficient documentation

## 2021-04-18 DIAGNOSIS — Z8669 Personal history of other diseases of the nervous system and sense organs: Secondary | ICD-10-CM | POA: Diagnosis not present

## 2021-04-18 DIAGNOSIS — R569 Unspecified convulsions: Secondary | ICD-10-CM

## 2021-04-18 DIAGNOSIS — Z7982 Long term (current) use of aspirin: Secondary | ICD-10-CM | POA: Insufficient documentation

## 2021-04-18 LAB — URINALYSIS, COMPLETE (UACMP) WITH MICROSCOPIC
Bacteria, UA: NONE SEEN
Bilirubin Urine: NEGATIVE
Glucose, UA: NEGATIVE mg/dL
Hgb urine dipstick: NEGATIVE
Ketones, ur: NEGATIVE mg/dL
Leukocytes,Ua: NEGATIVE
Nitrite: NEGATIVE
Protein, ur: NEGATIVE mg/dL
Specific Gravity, Urine: 1.023 (ref 1.005–1.030)
Squamous Epithelial / HPF: NONE SEEN (ref 0–5)
pH: 5 (ref 5.0–8.0)

## 2021-04-18 LAB — CBC WITH DIFFERENTIAL/PLATELET
Abs Immature Granulocytes: 0.01 10*3/uL (ref 0.00–0.07)
Basophils Absolute: 0 10*3/uL (ref 0.0–0.1)
Basophils Relative: 1 %
Eosinophils Absolute: 0.1 10*3/uL (ref 0.0–0.5)
Eosinophils Relative: 2 %
HCT: 42.7 % (ref 39.0–52.0)
Hemoglobin: 14.2 g/dL (ref 13.0–17.0)
Immature Granulocytes: 0 %
Lymphocytes Relative: 41 %
Lymphs Abs: 3.5 10*3/uL (ref 0.7–4.0)
MCH: 31.4 pg (ref 26.0–34.0)
MCHC: 33.3 g/dL (ref 30.0–36.0)
MCV: 94.5 fL (ref 80.0–100.0)
Monocytes Absolute: 0.7 10*3/uL (ref 0.1–1.0)
Monocytes Relative: 9 %
Neutro Abs: 4.1 10*3/uL (ref 1.7–7.7)
Neutrophils Relative %: 47 %
Platelets: 145 10*3/uL — ABNORMAL LOW (ref 150–400)
RBC: 4.52 MIL/uL (ref 4.22–5.81)
RDW: 13 % (ref 11.5–15.5)
WBC: 8.5 10*3/uL (ref 4.0–10.5)
nRBC: 0 % (ref 0.0–0.2)

## 2021-04-18 LAB — BASIC METABOLIC PANEL
Anion gap: 8 (ref 5–15)
BUN: 13 mg/dL (ref 8–23)
CO2: 27 mmol/L (ref 22–32)
Calcium: 9.3 mg/dL (ref 8.9–10.3)
Chloride: 105 mmol/L (ref 98–111)
Creatinine, Ser: 1.12 mg/dL (ref 0.61–1.24)
GFR, Estimated: >60 mL/min (ref >60)
Glucose, Bld: 100 mg/dL — ABNORMAL HIGH (ref 70–99)
Potassium: 4.4 mmol/L (ref 3.5–5.1)
Sodium: 140 mmol/L (ref 135–145)
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
## 2021-04-18 NOTE — ED Notes (Signed)
Seizure pads are in place. 
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
## 2021-04-18 NOTE — ED Notes (Signed)
Patient's brother at bedside.

## 2021-04-18 NOTE — ED Notes (Signed)
Family at bedside. 

## 2021-04-18 NOTE — Discharge Instructions (Addendum)
As we discussed please do not drive, or put yourself in a situation that would be dangerous to yourself or others if you were to have another seizure until you are cleared by your neurologist. Please seek medical attention for any high fevers, chest pain, shortness of breath, change in behavior, persistent vomiting, bloody stool or any other new or concerning symptoms.
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
## 2021-04-18 NOTE — ED Notes (Signed)
Report given to Haley RN.

## 2021-04-18 NOTE — ED Notes (Signed)
Patient states his last seizure was two years ago.

## 2021-04-25 ENCOUNTER — Ambulatory Visit: Payer: Self-pay | Admitting: Urology

## 2023-01-03 ENCOUNTER — Other Ambulatory Visit: Payer: Self-pay

## 2023-01-03 ENCOUNTER — Emergency Department
Admission: EM | Admit: 2023-01-03 | Discharge: 2023-01-03 | Disposition: A | Discharge status: Home / Self Care | Payer: Medicare PPO | Attending: Emergency Medicine | Admitting: Emergency Medicine

## 2023-01-03 DIAGNOSIS — I10 Essential (primary) hypertension: Secondary | ICD-10-CM | POA: Diagnosis not present

## 2023-01-03 DIAGNOSIS — R569 Unspecified convulsions: Secondary | ICD-10-CM | POA: Diagnosis not present

## 2023-01-03 LAB — CBC WITH DIFFERENTIAL/PLATELET
Abs Immature Granulocytes: 0.02 10*3/uL (ref 0.00–0.07)
Basophils Absolute: 0 10*3/uL (ref 0.0–0.1)
Basophils Relative: 0 %
Eosinophils Absolute: 0.1 10*3/uL (ref 0.0–0.5)
Eosinophils Relative: 1 %
HCT: 42 % (ref 39.0–52.0)
Hemoglobin: 13.7 g/dL (ref 13.0–17.0)
Immature Granulocytes: 0 %
Lymphocytes Relative: 33 %
Lymphs Abs: 2.3 10*3/uL (ref 0.7–4.0)
MCH: 31.8 pg (ref 26.0–34.0)
MCHC: 32.6 g/dL (ref 30.0–36.0)
MCV: 97.4 fL (ref 80.0–100.0)
Monocytes Absolute: 0.6 10*3/uL (ref 0.1–1.0)
Monocytes Relative: 8 %
Neutro Abs: 4 10*3/uL (ref 1.7–7.7)
Neutrophils Relative %: 58 %
Platelets: 143 10*3/uL — ABNORMAL LOW (ref 150–400)
RBC: 4.31 MIL/uL (ref 4.22–5.81)
RDW: 13.1 % (ref 11.5–15.5)
WBC: 7 10*3/uL (ref 4.0–10.5)
nRBC: 0 % (ref 0.0–0.2)

## 2023-01-03 LAB — BASIC METABOLIC PANEL
Anion gap: 5 (ref 5–15)
BUN: 17 mg/dL (ref 8–23)
CO2: 30 mmol/L (ref 22–32)
Calcium: 9.3 mg/dL (ref 8.9–10.3)
Chloride: 105 mmol/L (ref 98–111)
Creatinine, Ser: 1.3 mg/dL — ABNORMAL HIGH (ref 0.61–1.24)
GFR, Estimated: 60 mL/min — ABNORMAL LOW (ref >60)
Glucose, Bld: 89 mg/dL (ref 70–99)
Potassium: 3.7 mmol/L (ref 3.5–5.1)
Sodium: 140 mmol/L (ref 135–145)

## 2023-01-03 MED ORDER — LEVETIRACETAM 500 MG PO TABS
1500.0000 mg | ORAL_TABLET | ORAL | Status: AC
  Administered 2023-01-03: 1500 mg via ORAL
  Filled 2023-01-03: qty 3
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
## 2023-01-03 NOTE — Discharge Instructions (Signed)
Your lab tests were all okay.  Continue taking your Keppra 1500 mg 2 times a day, and call your doctor tomorrow for a follow-up appointment.  Continue with seizure precautions in the meantime.
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
## 2023-01-07 LAB — PHENYTOIN LEVEL, FREE AND TOTAL
Phenytoin, Free: NOT DETECTED ug/mL (ref 1.0–2.0)
Phenytoin, Total: <0.8 ug/mL — ABNORMAL LOW (ref 10.0–20.0)

## 2023-09-10 ENCOUNTER — Encounter: Payer: Self-pay | Admitting: Urology

## 2023-09-10 ENCOUNTER — Ambulatory Visit: Payer: Medicare PPO | Admitting: Urology

## 2023-09-10 DIAGNOSIS — R972 Elevated prostate specific antigen [PSA]: Secondary | ICD-10-CM | POA: Diagnosis not present

## 2023-09-10 NOTE — Patient Instructions (Signed)
Avoid any ejaculations starting 3 days prior to your PSA blood test, as this can cause it to be falsely elevated  Prostate Cancer Screening  Prostate cancer screening is testing that is done to check for the presence of prostate cancer in men. The prostate gland is a walnut-sized gland that is located below the bladder and in front of the rectum in males. The function of the prostate is to add fluid to semen during ejaculation. Prostate cancer is one of the most common types of cancer in men. Who should have prostate cancer screening? Screening recommendations vary based on age and other risk factors, as well as between the professional organizations who make the recommendations. In general, screening is recommended if: You are age 39 to 57 and have an average risk for prostate cancer. You should talk with your health care provider about your need for screening and how often screening should be done. Because most prostate cancers are slow growing and will not cause death, screening in this age group is generally reserved for men who have a 10- to 15-year life expectancy. You are younger than age 49, and you have these risk factors: Having a father, brother, or uncle who has been diagnosed with prostate cancer. The risk is higher if your family member's cancer occurred at an early age or if you have multiple family members with prostate cancer at an early age. Being a male who is [place] or is of [place] or sub-Saharan African descent. In general, screening is not recommended if: You are younger than age 1. You are between the ages of 46 and 45 and you have no risk factors. You are 49 years of age or older. At this age, the risks that screening can cause are greater than the benefits that it may provide. If you are at high risk for prostate cancer, your health care provider may recommend that you have screenings more often or that you start screening at a younger age. How is screening for prostate  cancer done? The recommended prostate cancer screening test is a blood test called the prostate-specific antigen (PSA) test. PSA is a protein that is made in the prostate. As you age, your prostate naturally produces more PSA. Abnormally high PSA levels may be caused by: Prostate cancer. An enlarged prostate that is not caused by cancer (benign prostatic hyperplasia, or BPH). This condition is very common in older men. A prostate gland infection (prostatitis) or urinary tract infection. Certain medicines such as male hormones (like testosterone) or other medicines that raise testosterone levels. A rectal exam may be done as part of prostate cancer screening to help provide information about the size of your prostate gland. When a rectal exam is performed, it should be done after the PSA level is drawn to avoid any effect on the results. Depending on the PSA results, you may need more tests, such as: A physical exam to check the size of your prostate gland, if not done as part of screening. Blood and imaging tests. A procedure to remove tissue samples from your prostate gland for testing (biopsy). This is the only way to know for certain if you have prostate cancer. What are the benefits of prostate cancer screening? Screening can help to identify cancer at an early stage, before symptoms start and when the cancer can be treated more easily. There is a small chance that screening may lower your risk of dying from prostate cancer. The chance is small because prostate cancer is a  slow-growing cancer, and most men with prostate cancer die from a different cause. What are the risks of prostate cancer screening? The main risk of prostate cancer screening is diagnosing and treating prostate cancer that would never have caused any symptoms or problems. This is called overdiagnosisand overtreatment. PSA screening cannot tell you if your PSA is high due to cancer or a different cause. A prostate biopsy is the  only procedure to diagnose prostate cancer. Even the results of a biopsy may not tell you if your cancer needs to be treated. Slow-growing prostate cancer may not need any treatment other than monitoring, so diagnosing and treating it may cause unnecessary stress or other side effects. Questions to ask your health care provider When should I start prostate cancer screening? What is my risk for prostate cancer? How often do I need screening? What type of screening tests do I need? How do I get my test results? What do my results mean? Do I need treatment? Where to find more information The American Cancer Society: www.cancer.org American Urological Association: www.auanet.org Contact a health care provider if: You have difficulty urinating. You have pain when you urinate or ejaculate. You have blood in your urine or semen. You have pain in your back or in the area of your prostate. Summary Prostate cancer is a common type of cancer in men. The prostate gland is located below the bladder and in front of the rectum. This gland adds fluid to semen during ejaculation. Prostate cancer screening may identify cancer at an early stage, when the cancer can be treated more easily and is less likely to have spread to other areas of the body. The prostate-specific antigen (PSA) test is the recommended screening test for prostate cancer, but it has associated risks. Discuss the risks and benefits of prostate cancer screening with your health care provider. If you are age 67 or older, the risks that screening can cause are greater than the benefits that it may provide. This information is not intended to replace advice given to you by your health care provider. Make sure you discuss any questions you have with your health care provider. Document Revised: 05/22/2021 Document Reviewed: 05/22/2021 Elsevier Patient Education  2024 [employer].

## 2023-09-10 NOTE — Progress Notes (Signed)
   09/10/23 2:20 PM   Barbette Hair [birth year] [id number]  CC: Elevated PSA  HPI: 67 year old African-American male with history of well-controlled epilepsy who presents for mildly elevated PSA of 4.96.  I actually saw him in April 2021 when PSA was as high as 10.77, and on repeat decreased to 6.7(20% free) then 3.6.  I recommended yearly follow-up for PSA, he has not been seen with urology, but has continued a PSA checked with PCP which has overall been stable.  PSA in February 2022 was 4.48, March 2023 4.07, and September 2024 4.96.  He denies any urinary complaints.  He denies any family history of prostate cancer.   PMH: Past Medical History:  Diagnosis Date   Elevated alkaline phosphatase level 02/22/2014   159  02/2014   Hyperlipemia 02/20/2014   Hypertension    Myeloma (HCC) 09/18/2017   Peripheral edema 02/20/2014   Seizure disorder (HCC) 12/15/2016   Seizures (HCC)    Smoker 03/10/2020   Syncope 04/25/2017   Thrombocytopenia (HCC) 04/25/2017    Surgical History: Past Surgical History:  Procedure Laterality Date   none        Family History: Family History  Problem Relation Age of Onset   Prostate cancer Neg Hx    Bladder Cancer Neg Hx    Kidney cancer Neg Hx     Social History:  reports that he has been smoking cigarettes. He has been exposed to tobacco smoke. He has never used smokeless tobacco. He reports that he does not currently use alcohol. He reports that he does not currently use drugs.  Constitutional:  Alert and oriented, No acute distress. Cardiovascular: No clubbing, cyanosis, or edema. Respiratory: Normal respiratory effort, no increased work of breathing. GI: Abdomen is soft, nontender, nondistended, no abdominal masses   Assessment & Plan:   67 year old African-American male with history of variable PSA over the last 5 years, with overall stability over the last 3 years.  Percentage reflex to free has been reassuring in the past as high as  20%.  We reviewed the implications of an elevated PSA and the uncertainty surrounding it. In general, a man's PSA increases with age and is produced by both normal and cancerous prostate tissue. The differential diagnosis for elevated PSA includes BPH, prostate cancer, infection, recent intercourse/ejaculation, recent urethroscopic manipulation (foley placement/cystoscopy) or trauma, and prostatitis. Management of an elevated PSA can include observation or prostate biopsy and we discussed this in detail. Our goal is to detect clinically significant prostate cancers, and manage with either active surveillance, surgery, or radiation for localized disease. Risks of prostate biopsy include bleeding, infection (including life threatening sepsis), pain, and lower urinary symptoms. Hematuria, hematospermia, and blood in the stool are all common after biopsy and can persist up to 4 weeks.   We discussed other options like a repeat PSA in 6 months with reflex to free or prostate MRI.  Using shared decision making he was fairly adamantly opposed to biopsy or MRI would like to repeat the PSA in 6 months.  I think this is reasonable based on his highly variable PSA over the last 5 years, and relative stability over the last 3 years.  RTC 6 months PSA reflex to free prior, if significantly elevated consider biopsy, MRI, or 4K score  Legrand Rams, MD 09/10/2023  [facility] [address], [address] [place], [place] [address] [phone]

## 2023-11-27 ENCOUNTER — Emergency Department
Admission: EM | Admit: 2023-11-27 | Discharge: 2023-11-27 | Discharge status: Against Medical Advice | Payer: Medicare PPO | Attending: Student | Admitting: Student

## 2023-11-27 ENCOUNTER — Emergency Department: Payer: Medicare PPO

## 2023-11-27 ENCOUNTER — Other Ambulatory Visit: Payer: Self-pay

## 2023-11-27 DIAGNOSIS — Z5321 Procedure and treatment not carried out due to patient leaving prior to being seen by health care provider: Secondary | ICD-10-CM | POA: Diagnosis not present

## 2023-11-27 DIAGNOSIS — R4182 Altered mental status, unspecified: Secondary | ICD-10-CM | POA: Diagnosis present

## 2023-11-27 DIAGNOSIS — R55 Syncope and collapse: Secondary | ICD-10-CM | POA: Insufficient documentation

## 2023-11-27 LAB — CBC WITH DIFFERENTIAL/PLATELET
Abs Immature Granulocytes: 0.02 10*3/uL (ref 0.00–0.07)
Basophils Absolute: 0 10*3/uL (ref 0.0–0.1)
Basophils Relative: 1 %
Eosinophils Absolute: 0.1 10*3/uL (ref 0.0–0.5)
Eosinophils Relative: 1 %
HCT: 46.5 % (ref 39.0–52.0)
Hemoglobin: 15.6 g/dL (ref 13.0–17.0)
Immature Granulocytes: 0 %
Lymphocytes Relative: 33 %
Lymphs Abs: 2.6 10*3/uL (ref 0.7–4.0)
MCH: 32.8 pg (ref 26.0–34.0)
MCHC: 33.5 g/dL (ref 30.0–36.0)
MCV: 97.7 fL (ref 80.0–100.0)
Monocytes Absolute: 0.6 10*3/uL (ref 0.1–1.0)
Monocytes Relative: 8 %
Neutro Abs: 4.6 10*3/uL (ref 1.7–7.7)
Neutrophils Relative %: 57 %
Platelets: 93 10*3/uL — ABNORMAL LOW (ref 150–400)
RBC: 4.76 MIL/uL (ref 4.22–5.81)
RDW: 13.2 % (ref 11.5–15.5)
WBC: 7.9 10*3/uL (ref 4.0–10.5)
nRBC: 0 % (ref 0.0–0.2)

## 2023-11-27 LAB — BASIC METABOLIC PANEL
Anion gap: 8 (ref 5–15)
BUN: 15 mg/dL (ref 8–23)
CO2: 27 mmol/L (ref 22–32)
Calcium: 9.4 mg/dL (ref 8.9–10.3)
Chloride: 104 mmol/L (ref 98–111)
Creatinine, Ser: 1.1 mg/dL (ref 0.61–1.24)
GFR, Estimated: >60 mL/min (ref >60)
Glucose, Bld: 133 mg/dL — ABNORMAL HIGH (ref 70–99)
Potassium: 3.9 mmol/L (ref 3.5–5.1)
Sodium: 139 mmol/L (ref 135–145)

## 2023-11-27 NOTE — ED Triage Notes (Signed)
See first nurse note:  Pt states HX of seizures and states it is possible he may have had a seizure. Pt seems he may be coming out a post ictal period. Pt is alert and knows his name and that he is in the hospital, pt states last thing he remembers is talking about refinancing his car at the [employer] dealership. Pt denies any injury.

## 2023-11-27 NOTE — ED Provider Triage Note (Signed)
Emergency Medicine Provider Triage Evaluation Note  Steven Ortiz , a 66 y.o. male  was evaluated in triage.  Pt complains of syncope. Went to car dealership and had seizure vs syncope. Patient doesn't remember the event. H/o of seizures but has not missed doses. No incontinence. No tongue biting. Denies complaints.  Review of Systems  Positive: syncope Negative: pain  Physical Exam  There were no vitals taken for this visit. Gen:   Awake, no distress   Resp:  Normal effort  MSK:   Moves extremities without difficulty  Other:  No tongue trauma  Medical Decision Making  Medically screening exam initiated at 3:37 PM.  Appropriate orders placed.  Steven Ortiz was informed that the remainder of the evaluation will be completed by another provider, this initial triage assessment does not replace that evaluation, and the importance of remaining in the ED until their evaluation is complete.     Steven Hoehn, PA-C 11/27/23 1541

## 2023-11-27 NOTE — ED Triage Notes (Signed)
Pt comes via EMS from [place] [employer]. Pt states all of sudden he blacked out. Pt then stood up and started to take off his pants. Pt just now coming around and remembers being at car dealership but doesn't remember the event.   [id number] 136/70 95 96% RA

## 2023-11-28 LAB — LEVETIRACETAM LEVEL: Levetiracetam Lvl: 55.6 ug/mL — ABNORMAL HIGH (ref 10.0–40.0)

## 2024-03-10 ENCOUNTER — Ambulatory Visit (INDEPENDENT_AMBULATORY_CARE_PROVIDER_SITE_OTHER): Payer: Self-pay | Admitting: Urology

## 2024-03-10 VITALS — BP 153/75 | HR 97 | Wt 160.4 lb

## 2024-03-10 DIAGNOSIS — R972 Elevated prostate specific antigen [PSA]: Secondary | ICD-10-CM | POA: Diagnosis not present

## 2024-03-10 NOTE — Progress Notes (Signed)
   03/10/2024 1:49 PM   Steven Ortiz 06-06-1956 [id number]  Reason for visit: Follow up elevated PSA  HPI: 68 year old African-American male with well-controlled epilepsy who I originally saw in October 2024 for mildly elevated PSA of 4.96.  He has a long history of variable PSA including 10.7 in April 2021 which decreased on repeat to 6.7 with 20% free, then 3.6.  PSA has varied between 4 and 5 since that time with PCP.  We discussed options including a PHI score/4K score, prostate MRI, or prostate biopsy.  He is quite averse to MRI or biopsy and opted for a 5 score.  This has not yet been completed.  PHI score today, call with results, consider MRI or biopsy if suspicious for prostate cancer  Sondra Come, MD  [facility] [address], [address] [place], [place] [address] [phone]

## 2024-03-10 NOTE — Patient Instructions (Signed)
 Prostate Cancer Screening  Prostate cancer screening is testing that is done to check for the presence of prostate cancer in men. The prostate gland is a walnut-sized gland that is located below the bladder and in front of the rectum in males. The function of the prostate is to add fluid to semen during ejaculation. Prostate cancer is one of the most common types of cancer in men. Who should have prostate cancer screening? Screening recommendations vary based on age and other risk factors, as well as between the professional organizations who make the recommendations. In general, screening is recommended if: You are age 59 to 46 and have an average risk for prostate cancer. You should talk with your health care provider about your need for screening and how often screening should be done. Because most prostate cancers are slow growing and will not cause death, screening in this age group is generally reserved for men who have a 10- to 15-year life expectancy. You are younger than age 64, and you have these risk factors: Having a father, brother, or uncle who has been diagnosed with prostate cancer. The risk is higher if your family member's cancer occurred at an early age or if you have multiple family members with prostate cancer at an early age. Being a male who is [place] or is of [place] or sub-Saharan African descent. In general, screening is not recommended if: You are younger than age 71. You are between the ages of 93 and 30 and you have no risk factors. You are 74 years of age or older. At this age, the risks that screening can cause are greater than the benefits that it may provide. If you are at high risk for prostate cancer, your health care provider may recommend that you have screenings more often or that you start screening at a younger age. How is screening for prostate cancer done? The recommended prostate cancer screening test is a blood test called the prostate-specific antigen  (PSA) test. PSA is a protein that is made in the prostate. As you age, your prostate naturally produces more PSA. Abnormally high PSA levels may be caused by: Prostate cancer. An enlarged prostate that is not caused by cancer (benign prostatic hyperplasia, or BPH). This condition is very common in older men. A prostate gland infection (prostatitis) or urinary tract infection. Certain medicines such as male hormones (like testosterone) or other medicines that raise testosterone levels. A rectal exam may be done as part of prostate cancer screening to help provide information about the size of your prostate gland. When a rectal exam is performed, it should be done after the PSA level is drawn to avoid any effect on the results. Depending on the PSA results, you may need more tests, such as: A physical exam to check the size of your prostate gland, if not done as part of screening. Blood and imaging tests. A procedure to remove tissue samples from your prostate gland for testing (biopsy). This is the only way to know for certain if you have prostate cancer. What are the benefits of prostate cancer screening? Screening can help to identify cancer at an early stage, before symptoms start and when the cancer can be treated more easily. There is a small chance that screening may lower your risk of dying from prostate cancer. The chance is small because prostate cancer is a slow-growing cancer, and most men with prostate cancer die from a different cause. What are the risks of prostate cancer screening? The  main risk of prostate cancer screening is diagnosing and treating prostate cancer that would never have caused any symptoms or problems. This is called overdiagnosisand overtreatment. PSA screening cannot tell you if your PSA is high due to cancer or a different cause. A prostate biopsy is the only procedure to diagnose prostate cancer. Even the results of a biopsy may not tell you if your cancer needs to  be treated. Slow-growing prostate cancer may not need any treatment other than monitoring, so diagnosing and treating it may cause unnecessary stress or other side effects. Questions to ask your health care provider When should I start prostate cancer screening? What is my risk for prostate cancer? How often do I need screening? What type of screening tests do I need? How do I get my test results? What do my results mean? Do I need treatment? Where to find more information The American Cancer Society: www.cancer.org American Urological Association: www.auanet.org Contact a health care provider if: You have difficulty urinating. You have pain when you urinate or ejaculate. You have blood in your urine or semen. You have pain in your back or in the area of your prostate. Summary Prostate cancer is a common type of cancer in men. The prostate gland is located below the bladder and in front of the rectum. This gland adds fluid to semen during ejaculation. Prostate cancer screening may identify cancer at an early stage, when the cancer can be treated more easily and is less likely to have spread to other areas of the body. The prostate-specific antigen (PSA) test is the recommended screening test for prostate cancer, but it has associated risks. Discuss the risks and benefits of prostate cancer screening with your health care provider. If you are age 73 or older, the risks that screening can cause are greater than the benefits that it may provide. This information is not intended to replace advice given to you by your health care provider. Make sure you discuss any questions you have with your health care provider. Document Revised: 05/22/2021 Document Reviewed: 05/22/2021 Elsevier Patient Education  2024 [employer].

## 2024-03-11 LAB — SPECIMEN STATUS REPORT

## 2024-03-13 LAB — SPECIMEN STATUS REPORT

## 2024-03-13 LAB — PROSTATE HEALTH INDEX

## 2024-03-18 ENCOUNTER — Telehealth: Payer: Self-pay

## 2024-03-18 DIAGNOSIS — R972 Elevated prostate specific antigen [PSA]: Secondary | ICD-10-CM

## 2024-03-18 NOTE — Telephone Encounter (Signed)
 Called pt no answer. LM for pt informing him that [facility] made an error with his specimen (wrong tube sent) and therefore we need to schedule him for a repeat lab draw at no cost. Advised pt to call back. PHI ordered.

## 2024-03-18 NOTE — Telephone Encounter (Signed)
-----   Message from Sondra Come sent at 03/17/2024  4:25 PM EDT ----- Regarding: PHI score canceled Can you help with this?  Looks like test was canceled  Legrand Rams, MD 03/17/2024 ----- Message ----- From: Interface, [facility] Lab Results In Sent: 03/11/2024   7:38 AM EDT To: Sondra Come, MD

## 2024-03-20 ENCOUNTER — Other Ambulatory Visit

## 2024-08-20 ENCOUNTER — Other Ambulatory Visit: Payer: Self-pay | Admitting: Family Medicine

## 2024-08-20 DIAGNOSIS — Z136 Encounter for screening for cardiovascular disorders: Secondary | ICD-10-CM

## 2024-08-20 DIAGNOSIS — F17210 Nicotine dependence, cigarettes, uncomplicated: Secondary | ICD-10-CM

## 2024-09-02 ENCOUNTER — Ambulatory Visit
Admission: RE | Admit: 2024-09-02 | Discharge: 2024-09-02 | Disposition: A | Discharge status: Home / Self Care | Source: Ambulatory Visit | Attending: Family Medicine | Admitting: Family Medicine

## 2024-09-02 DIAGNOSIS — Z136 Encounter for screening for cardiovascular disorders: Secondary | ICD-10-CM | POA: Diagnosis present

## 2024-09-02 DIAGNOSIS — F17210 Nicotine dependence, cigarettes, uncomplicated: Secondary | ICD-10-CM | POA: Insufficient documentation

## 2024-09-16 ENCOUNTER — Encounter
Admission: RE | Disposition: A | Discharge status: Home / Self Care | Payer: Self-pay | Source: Home / Self Care | Attending: Internal Medicine

## 2024-09-16 ENCOUNTER — Ambulatory Visit: Admitting: Anesthesiology

## 2024-09-16 ENCOUNTER — Encounter: Payer: Self-pay | Admitting: Internal Medicine

## 2024-09-16 ENCOUNTER — Ambulatory Visit
Admission: RE | Admit: 2024-09-16 | Discharge: 2024-09-16 | Disposition: A | Discharge status: Home / Self Care | Attending: Internal Medicine | Admitting: Internal Medicine

## 2024-09-16 DIAGNOSIS — D125 Benign neoplasm of sigmoid colon: Secondary | ICD-10-CM | POA: Insufficient documentation

## 2024-09-16 DIAGNOSIS — E785 Hyperlipidemia, unspecified: Secondary | ICD-10-CM | POA: Insufficient documentation

## 2024-09-16 DIAGNOSIS — Z1211 Encounter for screening for malignant neoplasm of colon: Secondary | ICD-10-CM | POA: Insufficient documentation

## 2024-09-16 DIAGNOSIS — C9000 Multiple myeloma not having achieved remission: Secondary | ICD-10-CM | POA: Insufficient documentation

## 2024-09-16 DIAGNOSIS — Z7982 Long term (current) use of aspirin: Secondary | ICD-10-CM | POA: Diagnosis not present

## 2024-09-16 DIAGNOSIS — Z79899 Other long term (current) drug therapy: Secondary | ICD-10-CM | POA: Diagnosis not present

## 2024-09-16 DIAGNOSIS — K635 Polyp of colon: Secondary | ICD-10-CM | POA: Insufficient documentation

## 2024-09-16 DIAGNOSIS — K640 First degree hemorrhoids: Secondary | ICD-10-CM | POA: Insufficient documentation

## 2024-09-16 DIAGNOSIS — D124 Benign neoplasm of descending colon: Secondary | ICD-10-CM | POA: Insufficient documentation

## 2024-09-16 DIAGNOSIS — I10 Essential (primary) hypertension: Secondary | ICD-10-CM | POA: Insufficient documentation

## 2024-09-16 DIAGNOSIS — G40909 Epilepsy, unspecified, not intractable, without status epilepticus: Secondary | ICD-10-CM | POA: Diagnosis not present

## 2024-09-16 DIAGNOSIS — Z87891 Personal history of nicotine dependence: Secondary | ICD-10-CM | POA: Diagnosis not present

## 2024-09-16 DIAGNOSIS — D123 Benign neoplasm of transverse colon: Secondary | ICD-10-CM | POA: Diagnosis not present

## 2024-09-16 DIAGNOSIS — J449 Chronic obstructive pulmonary disease, unspecified: Secondary | ICD-10-CM | POA: Diagnosis not present

## 2024-09-16 HISTORY — PX: COLONOSCOPY: SHX5424

## 2024-09-16 HISTORY — PX: POLYPECTOMY: SHX149

## 2024-09-16 SURGERY — COLONOSCOPY
Anesthesia: General

## 2024-09-16 MED ORDER — EPHEDRINE 5 MG/ML INJ
INTRAVENOUS | Status: AC
  Filled 2024-09-16: qty 5

## 2024-09-16 MED ORDER — SODIUM CHLORIDE 0.9 % IV SOLN
INTRAVENOUS | Status: DC
  Administered 2024-09-16: 20 mL/h via INTRAVENOUS

## 2024-09-16 MED ORDER — PROPOFOL 10 MG/ML IV BOLUS
INTRAVENOUS | Status: DC | PRN
Start: 2024-09-16 — End: 2024-09-16
  Administered 2024-09-16: 40 mg via INTRAVENOUS
  Administered 2024-09-16: 20 mg via INTRAVENOUS

## 2024-09-16 MED ORDER — PHENYLEPHRINE 80 MCG/ML (10ML) SYRINGE FOR IV PUSH (FOR BLOOD PRESSURE SUPPORT)
PREFILLED_SYRINGE | INTRAVENOUS | Status: AC
  Filled 2024-09-16: qty 10

## 2024-09-16 MED ORDER — PHENYLEPHRINE 80 MCG/ML (10ML) SYRINGE FOR IV PUSH (FOR BLOOD PRESSURE SUPPORT)
PREFILLED_SYRINGE | INTRAVENOUS | Status: DC | PRN
  Administered 2024-09-16 (×2): 160 ug via INTRAVENOUS
  Administered 2024-09-16 (×3): 240 ug via INTRAVENOUS

## 2024-09-16 MED ORDER — PROPOFOL 500 MG/50ML IV EMUL
INTRAVENOUS | Status: DC | PRN
  Administered 2024-09-16: 75 ug/kg/min via INTRAVENOUS

## 2024-09-16 MED ORDER — LIDOCAINE HCL (CARDIAC) PF 100 MG/5ML IV SOSY
PREFILLED_SYRINGE | INTRAVENOUS | Status: DC | PRN
  Administered 2024-09-16: 60 mg via INTRAVENOUS

## 2024-09-16 MED ORDER — LIDOCAINE HCL (PF) 2 % IJ SOLN
INTRAMUSCULAR | Status: AC
  Filled 2024-09-16: qty 5

## 2024-09-16 NOTE — H&P (Signed)
 Outpatient short stay form Pre-procedure 09/16/2024 10:41 AM Steven Ortiz, M.D.  Primary Physician: Sionne George, M.D.  Reason for visit:  Colon cancer   History of present illness:  Colorectal cancer screening - Scheduled for initial screening colonoscopy, previously planned for April at [facility] but canceled - No prior colonoscopy performed - No family history of colon cancer or colon polyps  Gastrointestinal symptoms - No abdominal pain - No gastrointestinal bleeding - No changes in bowel habits, including constipation or diarrhea - No upper gastrointestinal symptoms such as nausea, vomiting, reflux, or heartburn  Seizure disorder - History of seizures - Currently taking Keppra , dosage increased to 4000 mg daily (two doses in the morning and two at night) - Experienced a seizure on Monday during an MRI, first seizure since April - No seizures for years prior to April  Cardiopulmonary symptoms - No chest pain - No shortness of breath     Current Facility-Administered Medications:    0.9 %  sodium chloride  infusion, , Intravenous, Continuous, Tamora, Selita Staiger K, MD, Last Rate: 20 mL/hr at 09/16/24 0949, 20 mL/hr at 09/16/24 0949  Medications Prior to Admission  Medication Sig Dispense Refill Last Dose/Taking   amlodipine-olmesartan (AZOR) 10-20 MG tablet Take 1 tablet by mouth daily.   09/16/2024 at  8:00 AM   aspirin EC 81 MG tablet Take 81 mg by mouth daily.   09/16/2024 at  8:00 AM   atorvastatin (LIPITOR) 10 MG tablet Take 10 mg by mouth daily.   09/16/2024 at  8:00 AM   levETIRAcetam  (KEPPRA ) 500 MG tablet Take 1,500 mg by mouth 2 (two) times daily.   09/16/2024 at  8:00 AM     No Known Allergies   Past Medical History:  Diagnosis Date   Elevated alkaline phosphatase level 02/22/2014   159  02/2014   Hyperlipemia 02/20/2014   Hypertension    Myeloma (HCC) 09/18/2017   Peripheral edema 02/20/2014   Seizure disorder (HCC) 12/15/2016   Seizures (HCC)    Smoker  03/10/2020   Syncope 04/25/2017   Thrombocytopenia 04/25/2017    Review of systems:  Otherwise negative.    Physical Exam  Gen: Alert, oriented. Appears stated age.  HEENT: [place]/AT. PERRLA. Lungs: CTA, no wheezes. CV: RR nl S1, S2. Abd: soft, benign, no masses. BS+ Ext: No edema. Pulses 2+    Planned procedures: Proceed with colonoscopy. The patient understands the nature of the planned procedure, indications, risks, alternatives and potential complications including but not limited to bleeding, infection, perforation, damage to internal organs and possible oversedation/side effects from anesthesia. The patient agrees and gives consent to proceed.  Please refer to procedure notes for findings, recommendations and patient disposition/instructions.     Steven Ortiz, M.D. Gastroenterology 09/16/2024  10:41 AM

## 2024-09-16 NOTE — Anesthesia Postprocedure Evaluation (Signed)
 Anesthesia Post Note  Patient: Steven Ortiz  Procedure(s) Performed: COLONOSCOPY POLYPECTOMY, INTESTINE  Patient location during evaluation: PACU Anesthesia Type: General Level of consciousness: awake Pain management: pain level controlled Vital Signs Assessment: post-procedure vital signs reviewed and stable Respiratory status: spontaneous breathing and nonlabored ventilation Cardiovascular status: blood pressure returned to baseline Anesthetic complications: no   No notable events documented.   Last Vitals:  Vitals:   09/16/24 1119 09/16/24 1139  BP: (!) 92/56 102/78  Pulse: 64   Resp: 17   Temp:    SpO2: 99%     Last Pain:  Vitals:   09/16/24 1119  TempSrc:   PainSc: 0-No pain                 VAN STAVEREN,Lucious Zou

## 2024-09-16 NOTE — Op Note (Signed)
 [facility] Gastroenterology Patient Name: Steven Ortiz Procedure Date: 09/16/2024 10:43 AM MRN: [id number] Account #: [id number] Date of Birth: [birth year] Admit Type: Outpatient Age: 68 Room: [facility] ENDO ROOM 2 Gender: Male Note Status: Finalized Instrument Name: Colon Scope [id number] Procedure:             Colonoscopy Indications:           Screening for colorectal malignant neoplasm Providers:             Daelon Dunivan K. Aundria MD, MD Referring MD:          Zachary Gulling (Referring MD) Medicines:             Propofol per Anesthesia Complications:         No immediate complications. Estimated blood loss:                         Minimal. Procedure:             Pre-Anesthesia Assessment:                        - The risks and benefits of the procedure and the                         sedation options and risks were discussed with the                         patient. All questions were answered and informed                         consent was obtained.                        - Patient identification and proposed procedure were                         verified prior to the procedure by the nurse. The                         procedure was verified in the procedure room.                        - ASA Grade Assessment: III - A patient with severe                         systemic disease.                        - After reviewing the risks and benefits, the patient                         was deemed in satisfactory condition to undergo the                         procedure.                        After obtaining informed consent, the colonoscope was                         passed under direct vision.  Throughout the procedure,                         the patient's blood pressure, pulse, and oxygen                         saturations were monitored continuously. The                         Colonoscope was introduced through the anus and                         advanced to the  the cecum, identified by appendiceal                         orifice and ileocecal valve. The colonoscopy was                         performed without difficulty. The patient tolerated                         the procedure well. The quality of the bowel                         preparation was good. The ileocecal valve, appendiceal                         orifice, and rectum were photographed. Findings:      The perianal and digital rectal examinations were normal. Pertinent       negatives include normal sphincter tone and no palpable rectal lesions.      Non-bleeding internal hemorrhoids were found during retroflexion. The       hemorrhoids were Grade I (internal hemorrhoids that do not prolapse).      Two sessile polyps were found in the splenic flexure. The polyps were 8       to 10 mm in size. These polyps were removed with a hot snare. Resection       and retrieval were complete. Estimated blood loss: none.      Two sessile polyps were found in the descending colon. The polyps were 3       to 5 mm in size. These polyps were removed with a piecemeal technique       using a cold biopsy forceps. Resection and retrieval were complete.       Estimated blood loss was minimal.      A 9 mm polyp was found in the proximal sigmoid colon. The polyp was       sessile. The polyp was removed with a hot snare. Resection and retrieval       were complete. Estimated blood loss: none.      A 7 mm polyp was found in the rectum. The polyp was sessile. The polyp       was removed with a cold snare. Resection and retrieval were complete.       Estimated blood loss was minimal.      The exam was otherwise without abnormality. Impression:            - Non-bleeding internal hemorrhoids.                        -  Two 8 to 10 mm polyps at the splenic flexure,                         removed with a hot snare. Resected and retrieved.                        - Two 3 to 5 mm polyps in the descending colon,                          removed piecemeal using a cold biopsy forceps.                         Resected and retrieved.                        - One 9 mm polyp in the proximal sigmoid colon,                         removed with a hot snare. Resected and retrieved.                        - One 7 mm polyp in the rectum, removed with a cold                         snare. Resected and retrieved.                        - The examination was otherwise normal. Recommendation:        - Patient has a contact number available for                         emergencies. The signs and symptoms of potential                         delayed complications were discussed with the patient.                         Return to normal activities tomorrow. Written                         discharge instructions were provided to the patient.                        - Resume previous diet.                        - Continue present medications.                        - Repeat colonoscopy is recommended for surveillance.                         The colonoscopy date will be determined after                         pathology results from today's exam become available  for review.                        - Return to GI office PRN.                        - The findings and recommendations were discussed with                         the patient. Procedure Code(s):     --- Professional ---                        [phone], Colonoscopy, flexible; with removal of                         tumor(s), polyp(s), or other lesion(s) by snare                         technique                        45380, 59, Colonoscopy, flexible; with biopsy, single                         or multiple Diagnosis Code(s):     --- Professional ---                        K64.0, First degree hemorrhoids                        D12.8, Benign neoplasm of rectum                        D12.5, Benign neoplasm of sigmoid colon                         D12.4, Benign neoplasm of descending colon                        D12.3, Benign neoplasm of transverse colon (hepatic                         flexure or splenic flexure)                        Z12.11, Encounter for screening for malignant neoplasm                         of colon CPT copyright 2022 [employer]. All rights reserved. The codes documented in this report are preliminary and upon coder review may  be revised to meet current compliance requirements. Ladell MARLA Boss MD, MD 09/16/2024 11:09:52 AM This report has been signed electronically. Number of Addenda: 0 Note Initiated On: 09/16/2024 10:43 AM Scope Withdrawal Time: 0 hours 8 minutes 18 seconds  Total Procedure Duration: 0 hours 16 minutes 2 seconds  Estimated Blood Loss:  Estimated blood loss was minimal.      [facility]

## 2024-09-16 NOTE — Transfer of Care (Signed)
 Immediate Anesthesia Transfer of Care Note  Patient: Manus Fickle  Procedure(s) Performed: COLONOSCOPY POLYPECTOMY, INTESTINE  Patient Location: PACU  Anesthesia Type:General  Level of Consciousness: sedated  Airway & Oxygen Therapy: Patient Spontanous Breathing  Post-op Assessment: Report given to RN and Post -op Vital signs reviewed and stable  Post vital signs: Reviewed and stable  Last Vitals:  Vitals Value Taken Time  BP    Temp 35.6 C 09/16/24 11:09  Pulse 64 09/16/24 11:09  Resp 17 09/16/24 11:09  SpO2 100 % 09/16/24 11:09    Last Pain:  Vitals:   09/16/24 1109  TempSrc: Temporal  PainSc: Asleep         Complications: No notable events documented.

## 2024-09-16 NOTE — Interval H&P Note (Signed)
 History and Physical Interval Note:  09/16/2024 10:42 AM  Steven Ortiz  has presented today for surgery, with the diagnosis of Colon cancer screening [Z12.11].  The various methods of treatment have been discussed with the patient and family. After consideration of risks, benefits and other options for treatment, the patient has consented to  Procedure(s): COLONOSCOPY (N/A) as a surgical intervention.  The patient's history has been reviewed, patient examined, no change in status, stable for surgery.  I have reviewed the patient's chart and labs.  Questions were answered to the patient's satisfaction.     Wimer, Genessa Beman

## 2024-09-16 NOTE — Anesthesia Preprocedure Evaluation (Signed)
 Anesthesia Evaluation  Patient identified by MRN, date of birth, ID band Patient awake    Reviewed: Allergy & Precautions, NPO status , Patient's Chart, lab work & pertinent test results  Airway Mallampati: II  TM Distance: >3 FB Neck ROM: Full    Dental  (+) Upper Dentures, Partial Lower   Pulmonary neg pulmonary ROS, COPD, Current Smoker and Patient abstained from smoking.   Pulmonary exam normal  + decreased breath sounds      Cardiovascular Exercise Tolerance: Good hypertension, Pt. on medications negative cardio ROS Normal cardiovascular exam     Neuro/Psych Seizures -, Well Controlled,  negative neurological ROS  negative psych ROS   GI/Hepatic negative GI ROS, Neg liver ROS,,,  Endo/Other  negative endocrine ROS    Renal/GU negative Renal ROS  negative genitourinary   Musculoskeletal   Abdominal Normal abdominal exam  (+)   Peds negative pediatric ROS (+)  Hematology negative hematology ROS (+)   Anesthesia Other Findings Past Medical History: 02/22/2014: Elevated alkaline phosphatase level     Comment:  159  02/2014 02/20/2014: Hyperlipemia No date: Hypertension 09/18/2017: Myeloma (HCC) 02/20/2014: Peripheral edema 12/15/2016: Seizure disorder (HCC) No date: Seizures (HCC) 03/10/2020: Smoker 04/25/2017: Syncope 04/25/2017: Thrombocytopenia  Past Surgical History: No date: none  BMI    Body Mass Index: 22.30 kg/m      Reproductive/Obstetrics negative OB ROS                              Anesthesia Physical Anesthesia Plan  ASA: 3  Anesthesia Plan: General   Post-op Pain Management:    Induction: Intravenous  PONV Risk Score and Plan: Propofol infusion and TIVA  Airway Management Planned: Natural Airway and Nasal Cannula  Additional Equipment:   Intra-op Plan:   Post-operative Plan:   Informed Consent: I have reviewed the patients History and Physical, chart,  labs and discussed the procedure including the risks, benefits and alternatives for the proposed anesthesia with the patient or authorized representative who has indicated his/her understanding and acceptance.     Dental Advisory Given  Plan Discussed with: CRNA  Anesthesia Plan Comments:         Anesthesia Quick Evaluation

## 2024-09-17 LAB — SURGICAL PATHOLOGY

## 2024-10-01 ENCOUNTER — Observation Stay
Admission: EM | Admit: 2024-10-01 | Discharge: 2024-10-02 | Disposition: A | Discharge status: Home / Self Care | Attending: Internal Medicine | Admitting: Internal Medicine

## 2024-10-01 DIAGNOSIS — G40901 Epilepsy, unspecified, not intractable, with status epilepticus: Principal | ICD-10-CM | POA: Insufficient documentation

## 2024-10-01 DIAGNOSIS — I10 Essential (primary) hypertension: Secondary | ICD-10-CM | POA: Insufficient documentation

## 2024-10-01 DIAGNOSIS — K529 Noninfective gastroenteritis and colitis, unspecified: Secondary | ICD-10-CM | POA: Diagnosis not present

## 2024-10-01 DIAGNOSIS — G40919 Epilepsy, unspecified, intractable, without status epilepticus: Principal | ICD-10-CM

## 2024-10-01 DIAGNOSIS — Z79899 Other long term (current) drug therapy: Secondary | ICD-10-CM | POA: Insufficient documentation

## 2024-10-01 DIAGNOSIS — R569 Unspecified convulsions: Principal | ICD-10-CM

## 2024-10-01 DIAGNOSIS — Z7982 Long term (current) use of aspirin: Secondary | ICD-10-CM | POA: Diagnosis not present

## 2024-10-01 DIAGNOSIS — R197 Diarrhea, unspecified: Secondary | ICD-10-CM | POA: Insufficient documentation

## 2024-10-01 DIAGNOSIS — F17210 Nicotine dependence, cigarettes, uncomplicated: Secondary | ICD-10-CM | POA: Diagnosis not present

## 2024-10-01 DIAGNOSIS — N179 Acute kidney failure, unspecified: Secondary | ICD-10-CM | POA: Diagnosis not present

## 2024-10-01 DIAGNOSIS — G40909 Epilepsy, unspecified, not intractable, without status epilepticus: Secondary | ICD-10-CM | POA: Diagnosis not present

## 2024-10-01 LAB — COMPREHENSIVE METABOLIC PANEL WITH GFR
ALT: 12 U/L (ref 0–44)
AST: 15 U/L (ref 15–41)
Albumin: 4.4 g/dL (ref 3.5–5.0)
Alkaline Phosphatase: 76 U/L (ref 38–126)
Anion gap: 11 (ref 5–15)
BUN: 31 mg/dL — ABNORMAL HIGH (ref 8–23)
CO2: 23 mmol/L (ref 22–32)
Calcium: 9.4 mg/dL (ref 8.9–10.3)
Chloride: 108 mmol/L (ref 98–111)
Creatinine, Ser: 3.61 mg/dL — ABNORMAL HIGH (ref 0.61–1.24)
GFR, Estimated: 17 mL/min — ABNORMAL LOW (ref >60)
Glucose, Bld: 97 mg/dL (ref 70–99)
Potassium: 4.2 mmol/L (ref 3.5–5.1)
Sodium: 142 mmol/L (ref 135–145)
Total Bilirubin: 0.7 mg/dL (ref 0.0–1.2)
Total Protein: 7.3 g/dL (ref 6.5–8.1)

## 2024-10-01 LAB — CBC WITH DIFFERENTIAL/PLATELET
Abs Immature Granulocytes: 0.02 K/uL (ref 0.00–0.07)
Basophils Absolute: 0 K/uL (ref 0.0–0.1)
Basophils Relative: 0 %
Eosinophils Absolute: 0 K/uL (ref 0.0–0.5)
Eosinophils Relative: 0 %
HCT: 45.4 % (ref 39.0–52.0)
Hemoglobin: 14.9 g/dL (ref 13.0–17.0)
Immature Granulocytes: 0 %
Lymphocytes Relative: 15 %
Lymphs Abs: 1.5 K/uL (ref 0.7–4.0)
MCH: 32.3 pg (ref 26.0–34.0)
MCHC: 32.8 g/dL (ref 30.0–36.0)
MCV: 98.3 fL (ref 80.0–100.0)
Monocytes Absolute: 1 K/uL (ref 0.1–1.0)
Monocytes Relative: 10 %
Neutro Abs: 7.6 K/uL (ref 1.7–7.7)
Neutrophils Relative %: 75 %
Platelets: 175 K/uL (ref 150–400)
RBC: 4.62 MIL/uL (ref 4.22–5.81)
RDW: 13.2 % (ref 11.5–15.5)
WBC: 10.1 K/uL (ref 4.0–10.5)
nRBC: 0 % (ref 0.0–0.2)

## 2024-10-01 LAB — MAGNESIUM: Magnesium: 2.6 mg/dL — ABNORMAL HIGH (ref 1.7–2.4)

## 2024-10-01 MED ORDER — ACETAMINOPHEN 325 MG PO TABS: 650.0000 mg | ORAL_TABLET | ORAL | Status: DC | PRN

## 2024-10-01 MED ORDER — SODIUM CHLORIDE 0.9 % IV SOLN: 75.0000 mL/h | INTRAVENOUS | Status: DC

## 2024-10-01 MED ORDER — ORAL CARE MOUTH RINSE: 15.0000 mL | OROMUCOSAL | Status: DC | PRN

## 2024-10-01 MED ORDER — ORAL CARE MOUTH RINSE
15.0000 mL | OROMUCOSAL | Status: DC
  Filled 2024-10-01 (×12): qty 15

## 2024-10-01 MED ORDER — ACETAMINOPHEN 650 MG RE SUPP: 650.0000 mg | RECTAL | Status: DC | PRN

## 2024-10-01 MED ORDER — SODIUM CHLORIDE 0.9 % IV BOLUS
1000.0000 mL | Freq: Once | INTRAVENOUS | Status: AC
  Administered 2024-10-01: 1000 mL via INTRAVENOUS

## 2024-10-01 MED ORDER — LEVETIRACETAM (KEPPRA) 500 MG/5 ML ADULT IV PUSH
2000.0000 mg | Freq: Once | INTRAVENOUS | Status: AC
  Administered 2024-10-01: 2000 mg via INTRAVENOUS
  Filled 2024-10-01: qty 20

## 2024-10-01 MED ORDER — ATORVASTATIN CALCIUM 20 MG PO TABS
10.0000 mg | ORAL_TABLET | Freq: Every day | ORAL | Status: DC
Start: 2024-10-01 — End: 2024-10-02
  Administered 2024-10-01: 10 mg via ORAL
  Filled 2024-10-01: qty 1

## 2024-10-01 MED ORDER — ONDANSETRON HCL 4 MG/2ML IJ SOLN: 4.0000 mg | Freq: Four times a day (QID) | INTRAMUSCULAR | Status: DC | PRN

## 2024-10-01 MED ORDER — ONDANSETRON HCL 4 MG PO TABS: 4.0000 mg | ORAL_TABLET | Freq: Four times a day (QID) | ORAL | Status: DC | PRN

## 2024-10-01 MED ORDER — LEVETIRACETAM 500 MG PO TABS
2000.0000 mg | ORAL_TABLET | Freq: Two times a day (BID) | ORAL | Status: DC
  Administered 2024-10-02: 2000 mg via ORAL
  Filled 2024-10-01: qty 4

## 2024-10-01 MED ORDER — ENOXAPARIN SODIUM 30 MG/0.3ML IJ SOSY
30.0000 mg | PREFILLED_SYRINGE | INTRAMUSCULAR | Status: DC
  Administered 2024-10-01: 30 mg via SUBCUTANEOUS
  Filled 2024-10-01: qty 0.3

## 2024-10-01 MED ORDER — AMLODIPINE BESYLATE 5 MG PO TABS: 10.0000 mg | ORAL_TABLET | Freq: Every day | ORAL | Status: DC

## 2024-10-01 MED ORDER — LORAZEPAM 2 MG/ML IJ SOLN: 1.0000 mg | INTRAMUSCULAR | Status: DC | PRN

## 2024-10-01 MED ORDER — ASPIRIN 81 MG PO TBEC
81.0000 mg | DELAYED_RELEASE_TABLET | Freq: Every day | ORAL | Status: DC
Start: 2024-10-01 — End: 2024-10-02
  Administered 2024-10-01 – 2024-10-02 (×2): 81 mg via ORAL
  Filled 2024-10-01 (×2): qty 1

## 2024-10-01 MED ORDER — LACTATED RINGERS IV SOLN: INTRAVENOUS | Status: AC

## 2024-10-01 NOTE — ED Provider Notes (Signed)
 [facility] Provider Note    Event Date/Time   First MD Initiated Contact with Patient 10/01/24 1607     (approximate)   History   Seizures   HPI  Steven Ortiz is a 68 year old male with history of encephalomalacia, seizure disorder on levetiracetam  presenting to the emergency department for evaluation of seizure-like activity.  Patient was at work when he had a witnessed episode of staring off.  No generalized tonic-clonic activity reported by EMS.  Patient was confused for several minutes, now feels back at his baseline.  Reports he had multiple episodes of diarrhea overnight, denies abdominal pain, otherwise has felt well recently.  Reports he has been compliant with his Keppra .  States that he takes 2 g twice daily.  Reports he typically has a few breakthrough seizures a year.  Reviewed his neurology visit from 05/26/2024.  This notes 2 types of seizures. One is milder with brief dizziness and altered awareness, has occasionally had episodes of generalized convulsions.  Keppra  increased to 2000 twice daily at that time.       Physical Exam   Triage Vital Signs: ED Triage Vitals [10/01/24 1607]  Encounter Vitals Group     BP 116/67     Girls Systolic BP Percentile      Girls Diastolic BP Percentile      Boys Systolic BP Percentile      Boys Diastolic BP Percentile      Pulse Rate (!) 112     Resp 16     Temp 97.9 F (36.6 C)     Temp Source Oral     SpO2 99 %     Weight      Height      Head Circumference      Peak Flow      Pain Score      Pain Loc      Pain Education      Exclude from Growth Chart     Most recent vital signs: Vitals:   10/01/24 1607 10/01/24 2000  BP: 116/67 109/69  Pulse: (!) 112 96  Resp: 16 17  Temp: 97.9 F (36.6 C) 98.1 F (36.7 C)  SpO2: 99% 99%     General: Awake, interactive  CV:  Good peripheral perfusion, mild tachycardia Resp:  Unlabored respirations Abd:  Nondistended, soft, nontender Neuro:   Alert and oriented, normal extraocular movements, symmetric facial movement, sensation intact over bilateral upper and lower extremities with 5 out of 5 strength.  Normal finger-to-nose testing.   ED Results / Procedures / Treatments   Labs (all labs ordered are listed, but only abnormal results are displayed) Labs Reviewed  COMPREHENSIVE METABOLIC PANEL WITH GFR - Abnormal; Notable for the following components:      Result Value   BUN 31 (*)    Creatinine, Ser 3.61 (*)    GFR, Estimated 17 (*)    All other components within normal limits  MAGNESIUM - Abnormal; Notable for the following components:   Magnesium 2.6 (*)    All other components within normal limits  GASTROINTESTINAL PANEL BY PCR, STOOL (REPLACES STOOL CULTURE)  CBC WITH DIFFERENTIAL/PLATELET  CBC WITH DIFFERENTIAL/PLATELET  LEVETIRACETAM  LEVEL  HIV ANTIBODY (ROUTINE TESTING W REFLEX)     EKG EKG independently reviewed and interpreted by myself demonstrates:    RADIOLOGY Imaging independently reviewed and interpreted by myself demonstrates:   Formal Radiology Read:  No results found.  PROCEDURES:  Critical Care performed: No  Procedures  MEDICATIONS ORDERED IN ED: Medications  aspirin EC tablet 81 mg (81 mg Oral Given 10/01/24 2216)  atorvastatin (LIPITOR) tablet 10 mg (10 mg Oral Given 10/01/24 2216)  levETIRAcetam  (KEPPRA ) tablet 2,000 mg (has no administration in time range)  amLODipine (NORVASC) tablet 10 mg (has no administration in time range)  Oral care mouth rinse (15 mLs Mouth Rinse Not Given 10/02/24 0001)  Oral care mouth rinse (has no administration in time range)  enoxaparin (LOVENOX) injection 30 mg (30 mg Subcutaneous Given 10/01/24 2217)  LORazepam (ATIVAN) injection 1 mg (has no administration in time range)  acetaminophen (TYLENOL) tablet 650 mg (has no administration in time range)    Or  acetaminophen (TYLENOL) suppository 650 mg (has no administration in time range)   ondansetron (ZOFRAN) tablet 4 mg (has no administration in time range)    Or  ondansetron (ZOFRAN) injection 4 mg (has no administration in time range)  lactated ringers infusion ( Intravenous New Bag/Given 10/01/24 2212)  sodium chloride  0.9 % bolus 1,000 mL (0 mLs Intravenous Stopped 10/01/24 2030)  levETIRAcetam  (KEPPRA ) undiluted injection 2,000 mg (2,000 mg Intravenous Given 10/01/24 1640)     IMPRESSION / MDM / ASSESSMENT AND PLAN / ED COURSE  I reviewed the triage vital signs and the nursing notes.  Differential diagnosis includes, but is not limited to, breakthrough seizure secondary to poor medication absorption with recent diarrhea, medication nonadherence, electrolyte abnormality, lower suspicion intracranial bleed or other acute intracranial process given known history of seizure disorder, similar clinical history, return to baseline and absence of focal deficits on exam  Patient's presentation is most consistent with acute presentation with potential threat to life or bodily function.  68 year old male presenting after an episode of staring off and confusion concerning for seizure-like episode.  Appears close to baseline on my initial evaluation.  Will obtain labs including Keppra  level for outpatient planning, given additional dose of Keppra  here as well as fluids given his recent diarrhea.  Reassuring abdominal exam and denies abdominal pain, do not think there is an indication for abdominal imaging currently.  Labs with reassuring CBC, CMP notable for significant AKI with creatinine of 3.61, baseline around 1.3.  Normal sodium.  Remains with without abdominal pain and reassuring abdominal exam.  Will obtain postvoid residual to evaluate for obstructive process.   Clinical Course as of 10/02/24 0004  Thu Oct 01, 2024  2030 PVR <50.  Suspicion obstructive process based on this.  Suspect likely dehydration with clinical history though pretty profound AKI with less than 24 hours  of diarrhea. [NR]  2057 With degree of AKI, do think patient is appropriate for admission for further monitoring.  Patient is agreeable.  Will reach out to hospitalist team. [NR]  2127 Case reviewed with Dr. Cleatus.  She will evaluate the patient for anticipated admission. [NR]    Clinical Course User Index [NR] Levander Slate, MD     FINAL CLINICAL IMPRESSION(S) / ED DIAGNOSES   Final diagnoses:  Seizure-like activity (HCC)  Acute kidney injury     Rx / DC Orders   ED Discharge Orders     None        Note:  This document was prepared using Dragon voice recognition software and may include unintentional dictation errors.   Levander Slate, MD 10/02/24 [phone]

## 2024-10-01 NOTE — ED Triage Notes (Signed)
 Pt to ED BIB [facility] from work with c/o seizure. Pt with hx of seizures but reports that today's was different. Compliant with seizure medications. BG 156 with EMS. Pt back to baseline.

## 2024-10-01 NOTE — H&P (Incomplete)
 History and Physical    Patient: Steven Ortiz FMW:[id number] DOB: [birth year] DOA: 10/01/2024 DOS: the patient was seen and examined on 10/01/2024 PCP: Zachary Idelia LABOR, MD  Patient coming from: Home  Chief Complaint:  Chief Complaint  Patient presents with   Seizures    HPI: Steven Ortiz is a 68 y.o. male with medical history significant for Seizures disorder on Keppra ,  HTN, being admitted principally for a breakthrough seizure in the setting of recent acute gastroenteritis, with incidental finding of AKI on workup .  Patient was at work when he had a witnessed episode of staring off followed by several minutes of confusion.  There was no tonic-clonic activity.  Patient states that for the past 2 days he had several episodes of diarrhea and an episode of nonbloody nonbilious vomiting.  He had very poor oral intake and was afraid to eat or drink because he felt like it would run right through him.  Denies abdominal pain, fever or chills.  His symptoms improved, had been had only 1 loose BM today and proceeded to go to work.  SABRA  He reports compliance with his Keppra  2 g daily, increased from 1.5 g daily in April when he had his last previous seizure.,  His last Keppra  level on 08/20/2024 was elevated at 86 (normal 10 to 40 mcg/mL) but he had no follow-up dose adjustments..  In the ED mildly tachycardic to 112 with otherwise normal vitals.  Labs notable for creatinine of 3.61 up from a baseline of 1.3 on 08/20/2024.  EKG showing sinus tachycardia at 107 with no concerning abnormalities  Patient treated with an NS bolus and given a 2 g Keppra  load. Admission requested.     Review of Systems: As mentioned in the history of present illness. All other systems reviewed and are negative.  Past Medical History:  Diagnosis Date   Elevated alkaline phosphatase level 02/22/2014   159  02/2014   Hyperlipemia 02/20/2014   Hypertension    Myeloma (HCC) 09/18/2017   Peripheral edema 02/20/2014    Seizure disorder (HCC) 12/15/2016   Seizures (HCC)    Smoker 03/10/2020   Syncope 04/25/2017   Thrombocytopenia 04/25/2017   Past Surgical History:  Procedure Laterality Date   COLONOSCOPY N/A 09/16/2024   Procedure: COLONOSCOPY;  Surgeon: Toledo, Ladell POUR, MD;  Location: [facility] ENDOSCOPY;  Service: Gastroenterology;  Laterality: N/A;   none     POLYPECTOMY  09/16/2024   Procedure: POLYPECTOMY, INTESTINE;  Surgeon: Toledo, Teodoro K, MD;  Location: [facility] ENDOSCOPY;  Service: Gastroenterology;;   Social History:  reports that he has been smoking cigarettes. He has been exposed to tobacco smoke. He has never used smokeless tobacco. He reports that he does not currently use alcohol. He reports that he does not currently use drugs.  No Known Allergies  Family History  Problem Relation Age of Onset   Prostate cancer Neg Hx    Bladder Cancer Neg Hx    Kidney cancer Neg Hx     Prior to Admission medications   Medication Sig Start Date End Date Taking? Authorizing Provider  amlodipine-olmesartan (AZOR) 10-20 MG tablet Take 1 tablet by mouth daily. 02/12/21   [provider]  aspirin EC 81 MG tablet Take 81 mg by mouth daily. 01/03/18   [provider]  atorvastatin (LIPITOR) 10 MG tablet Take 10 mg by mouth daily. 12/11/18   [provider]  levETIRAcetam  (KEPPRA ) 500 MG tablet Take 1,500 mg by mouth 2 (two) times daily. 12/13/18  [provider]    Physical Exam: Vitals:   10/01/24 1607 10/01/24 2000  BP: 116/67 109/69  Pulse: (!) 112 96  Resp: 16 17  Temp: 97.9 F (36.6 C) 98.1 F (36.7 C)  TempSrc: Oral Oral  SpO2: 99% 99%   Physical Exam Vitals and nursing note reviewed.  Constitutional:      General: He is not in acute distress. HENT:     Head: Normocephalic and atraumatic.  Cardiovascular:     Rate and Rhythm: Normal rate and regular rhythm.     Heart sounds: Normal heart sounds.  Pulmonary:     Effort: Pulmonary effort is normal.     Breath  sounds: Normal breath sounds.  Abdominal:     Palpations: Abdomen is soft.     Tenderness: There is no abdominal tenderness.  Neurological:     Mental Status: Mental status is at baseline.     Labs on Admission: I have personally reviewed following labs and imaging studies  CBC: Recent Labs  Lab 10/01/24 1830  WBC 10.1  NEUTROABS 7.6  HGB 14.9  HCT 45.4  MCV 98.3  PLT 175   Basic Metabolic Panel: Recent Labs  Lab 10/01/24 1830  NA 142  K 4.2  CL 108  CO2 23  GLUCOSE 97  BUN 31*  CREATININE 3.61*  CALCIUM 9.4  MG 2.6*   GFR: CrCl cannot be calculated (Unknown ideal weight.). Liver Function Tests: Recent Labs  Lab 10/01/24 1830  AST 15  ALT 12  ALKPHOS 76  BILITOT 0.7  PROT 7.3  ALBUMIN 4.4   No results for input(s): LIPASE, AMYLASE in the last 168 hours. No results for input(s): AMMONIA in the last 168 hours. Coagulation Profile: No results for input(s): INR, PROTIME in the last 168 hours. Cardiac Enzymes: No results for input(s): CKTOTAL, CKMB, CKMBINDEX, TROPONINI in the last 168 hours. BNP (last 3 results) No results for input(s): PROBNP in the last 8760 hours. HbA1C: No results for input(s): HGBA1C in the last 72 hours. CBG: No results for input(s): GLUCAP in the last 168 hours. Lipid Profile: No results for input(s): CHOL, HDL, LDLCALC, TRIG, CHOLHDL, LDLDIRECT in the last 72 hours. Thyroid Function Tests: No results for input(s): TSH, T4TOTAL, FREET4, T3FREE, THYROIDAB in the last 72 hours. Anemia Panel: No results for input(s): VITAMINB12, FOLATE, FERRITIN, TIBC, IRON, RETICCTPCT in the last 72 hours. Urine analysis:    Component Value Date/Time   COLORURINE YELLOW (A) 04/18/2021 2218   APPEARANCEUR CLEAR (A) 04/18/2021 2218   LABSPEC 1.023 04/18/2021 2218   PHURINE 5.0 04/18/2021 2218   GLUCOSEU NEGATIVE 04/18/2021 2218   HGBUR NEGATIVE 04/18/2021 2218   BILIRUBINUR NEGATIVE  04/18/2021 2218   KETONESUR NEGATIVE 04/18/2021 2218   PROTEINUR NEGATIVE 04/18/2021 2218   NITRITE NEGATIVE 04/18/2021 2218   LEUKOCYTESUR NEGATIVE 04/18/2021 2218    Radiological Exams on Admission: No results found. Data Reviewed for HPI: Relevant notes from primary care and specialist visits, past discharge summaries as available in EHR, including Care Everywhere. Prior diagnostic testing as pertinent to current admission diagnoses Updated medications and problem lists for reconciliation ED course, including vitals, labs, imaging, treatment and response to treatment Triage notes, nursing and pharmacy notes and ED provider's notes Notable results as noted above in HPI      Assessment and Plan: * Breakthrough seizure (HCC) Possibly related to poor GI absorption from recent diarrhea (recent Keppra  level was elevated-no dosage change) Continue home Keppra  2 g twice daily with Ativan  as needed seizure Follow Keppra  level Seizure precautions  AKI (acute kidney injury) Acute gastroenteritis-improving Creatinine of 3.61 up from a baseline of 1.3 on 08/20/2024 Likely secondary to ATN related to dehydration from acute gastroenteritis and poor oral intake Received an IV fluid bolus in the ED Continue IV fluids Hold home olmesartan Monitor renal function and avoid nephrotoxins-expecting improvement with IV hydration Will order GI PCR panel if diarrhea recurs  Hypertension  Holding olmesartan due to renal function         DVT prophylaxis: Lovenox  Consults: none  Advance Care Planning: full code  Family Communication: none  Disposition Plan: Back to previous home environment  Severity of Illness: The appropriate patient status for this patient is OBSERVATION. Observation status is judged to be reasonable and necessary in order to provide the required intensity of service to ensure the patient's safety. The patient's presenting symptoms, physical exam findings, and  initial radiographic and laboratory data in the context of their medical condition is felt to place them at decreased risk for further clinical deterioration. Furthermore, it is anticipated that the patient will be medically stable for discharge from the hospital within 2 midnights of admission.   Author: Delayne LULLA Solian, MD 10/01/2024 9:37 PM  For on call review [email or web address].

## 2024-10-01 NOTE — Assessment & Plan Note (Signed)
 Possibly related to poor GI absorption from recent diarrhea (recent Keppra  level was elevated-no dosage change) Continue home Keppra  2 g twice daily with Ativan as needed seizure Follow Keppra  level Seizure precautions

## 2024-10-01 NOTE — Assessment & Plan Note (Addendum)
 Holding olmesartan due to renal function

## 2024-10-01 NOTE — Assessment & Plan Note (Addendum)
 Acute gastroenteritis-improving Creatinine of 3.61 up from a baseline of 1.3 on 08/20/2024 Likely secondary to ATN related to dehydration from acute gastroenteritis and poor oral intake Received an IV fluid bolus in the ED Continue IV fluids Hold home olmesartan Monitor renal function and avoid nephrotoxins-expecting improvement with IV hydration Will order GI PCR panel if diarrhea recurs

## 2024-10-02 DIAGNOSIS — K529 Noninfective gastroenteritis and colitis, unspecified: Secondary | ICD-10-CM | POA: Insufficient documentation

## 2024-10-02 DIAGNOSIS — G40919 Epilepsy, unspecified, intractable, without status epilepticus: Secondary | ICD-10-CM | POA: Diagnosis not present

## 2024-10-02 LAB — GASTROINTESTINAL PANEL BY PCR, STOOL (REPLACES STOOL CULTURE)

## 2024-10-02 LAB — RENAL FUNCTION PANEL
Albumin: 3.9 g/dL (ref 3.5–5.0)
Anion gap: 13 (ref 5–15)
BUN: 27 mg/dL — ABNORMAL HIGH (ref 8–23)
CO2: 17 mmol/L — ABNORMAL LOW (ref 22–32)
Calcium: 9.3 mg/dL (ref 8.9–10.3)
Chloride: 110 mmol/L (ref 98–111)
Creatinine, Ser: 1.63 mg/dL — ABNORMAL HIGH (ref 0.61–1.24)
GFR, Estimated: 45 mL/min — ABNORMAL LOW (ref >60)
Glucose, Bld: 113 mg/dL — ABNORMAL HIGH (ref 70–99)
Phosphorus: 2.7 mg/dL (ref 2.5–4.6)
Potassium: 4.4 mmol/L (ref 3.5–5.1)
Sodium: 140 mmol/L (ref 135–145)

## 2024-10-02 LAB — MAGNESIUM: Magnesium: 2.6 mg/dL — ABNORMAL HIGH (ref 1.7–2.4)

## 2024-10-02 LAB — HIV ANTIBODY (ROUTINE TESTING W REFLEX): HIV Screen 4th Generation wRfx: NONREACTIVE

## 2024-10-02 MED ORDER — LEVETIRACETAM 500 MG PO TABS
2000.0000 mg | ORAL_TABLET | Freq: Two times a day (BID) | ORAL | Status: AC
Start: 2024-10-02 — End: ?

## 2024-10-02 MED ORDER — LACTATED RINGERS IV SOLN: INTRAVENOUS | Status: DC

## 2024-10-02 MED ORDER — AMLODIPINE-OLMESARTAN 10-20 MG PO TABS: 1.0000 | ORAL_TABLET | Freq: Every day | ORAL | Status: AC

## 2024-10-02 MED ORDER — AMLODIPINE BESYLATE 5 MG PO TABS: 10.0000 mg | ORAL_TABLET | Freq: Every day | ORAL | Status: DC

## 2024-10-02 NOTE — Discharge Summary (Signed)
 Physician Discharge Summary   Patient: Steven Ortiz MRN: [id number] DOB: 02-02-1956  Admit date:     10/01/2024  Discharge date: 10/02/24  Discharge Physician: AIDA CHO   PCP: Zachary Idelia LABOR, MD   Recommendations at discharge:   Follow-up with PCP on 10/05/2024 to repeat kidney function  Discharge Diagnoses: Principal Problem:   Breakthrough seizure (HCC) Active Problems:   AKI (acute kidney injury)   Hypertension   Acute gastroenteritis  Resolved Problems:   * No resolved hospital problems. [facility] Course:  Mr. Steven Ortiz is a 68 year old man with medical history significant for seizure disorder on Keppra , hypertension, who presented to the hospital because of breakthrough seizure.  He recently had vomiting and diarrhea a few days prior to admission but the symptoms have resolved.  He was at work when he had the episode of seizure that was witnessed.  This was followed by some confusion.  He was subsequently brought to the emergency department for further management.  He was found to have acute kidney injury with creatinine of 3.61.  Baseline creatinine is around 1.3.    Assessment and Plan:  Breakthrough seizure in a patient with seizure disorder: No seizures since admission.  He said he takes Keppra  2 g twice daily at home.  Continue home dose. Recent MRI brain at [facility] on 07/31/2024 did not show any intracranial mass but there was evidence of frontal encephalomalacia and small vessel ischemic disease.   Outpatient follow-up with neurologist.   AKI: Improved with IV fluids.  Creatinine down from 3.61-1.63.  Baseline creatinine is around 1.3.  Patient is feeling better and prefers to be discharged home today.  He understands that creatinine is not at baseline.  He said he can call his PCPs office to schedule an appointment on Monday, 10/05/2024 for repeat kidney function.    Hypertension: He was on amlodipine-olmesartan at home.   Resume on 10/04/2024.   Recent vomiting and diarrhea prior to admission: Resolved   His condition has improved.  He is ambulating to the bathroom on his own without any assistance.  He is deemed stable for discharge home today.  Discharge plan discussed with Jerel, brother, at the bedside.        Consultants: None Procedures performed: None Disposition: Home Diet recommendation:  Discharge Diet Orders (From admission, onward)     Start     Ordered   10/02/24 0000  Diet - low sodium heart healthy        10/02/24 1454           Cardiac diet DISCHARGE MEDICATION: Allergies as of 10/02/2024   No Known Allergies      Medication List     TAKE these medications    amlodipine-olmesartan 10-20 MG tablet Commonly known as: AZOR Take 1 tablet by mouth daily. Start taking on: October 04, 2024 What changed: These instructions start on October 04, 2024. If you are unsure what to do until then, ask your doctor or other care provider.   aspirin EC 81 MG tablet Take 81 mg by mouth daily.   atorvastatin 10 MG tablet Commonly known as: LIPITOR Take 10 mg by mouth daily.   levETIRAcetam  500 MG tablet Commonly known as: KEPPRA  Take 4 tablets (2,000 mg total) by mouth 2 (two) times daily. What changed: how much to take        Discharge Exam:  GEN: NAD SKIN: Warm and dry EYES: No pallor or icterus ENT: MMM CV: RRR  PULM: CTA B ABD: soft, ND, NT, +BS CNS: AAO x 3, non focal EXT: No edema or tenderness   Condition at discharge: good  The results of significant diagnostics from this hospitalization (including imaging, microbiology, ancillary and laboratory) are listed below for reference.   Imaging Studies: No results found.  Microbiology: Results for orders placed or performed during the hospital encounter of 10/01/24  Gastrointestinal Panel by PCR , Stool     Status: None   Collection Time: 10/02/24  6:36 AM   Specimen: Stool  Result Value Ref Range  Status   Campylobacter species NOT DETECTED NOT DETECTED Final   Plesimonas shigelloides NOT DETECTED NOT DETECTED Final   Salmonella species NOT DETECTED NOT DETECTED Final   Yersinia enterocolitica NOT DETECTED NOT DETECTED Final   Vibrio species NOT DETECTED NOT DETECTED Final   Vibrio cholerae NOT DETECTED NOT DETECTED Final   Enteroaggregative E coli (EAEC) NOT DETECTED NOT DETECTED Final   Enteropathogenic E coli (EPEC) NOT DETECTED NOT DETECTED Final   Enterotoxigenic E coli (ETEC) NOT DETECTED NOT DETECTED Final   Shiga like toxin producing E coli (STEC) NOT DETECTED NOT DETECTED Final   Shigella/Enteroinvasive E coli (EIEC) NOT DETECTED NOT DETECTED Final   Cryptosporidium NOT DETECTED NOT DETECTED Final   Cyclospora cayetanensis NOT DETECTED NOT DETECTED Final   Entamoeba histolytica NOT DETECTED NOT DETECTED Final   Giardia lamblia NOT DETECTED NOT DETECTED Final   Adenovirus F40/41 NOT DETECTED NOT DETECTED Final   Astrovirus NOT DETECTED NOT DETECTED Final   Norovirus GI/GII NOT DETECTED NOT DETECTED Final   Rotavirus A NOT DETECTED NOT DETECTED Final   Sapovirus (I, II, IV, and V) NOT DETECTED NOT DETECTED Final    Comment: Performed at [facility], [address] [place], [place] [address]    Labs: CBC: Recent Labs  Lab 10/01/24 1830  WBC 10.1  NEUTROABS 7.6  HGB 14.9  HCT 45.4  MCV 98.3  PLT 175   Basic Metabolic Panel: Recent Labs  Lab 10/01/24 1830 10/02/24 1217 10/02/24 1218  NA 142  --  140  K 4.2  --  4.4  CL 108  --  110  CO2 23  --  17*  GLUCOSE 97  --  113*  BUN 31*  --  27*  CREATININE 3.61*  --  1.63*  CALCIUM 9.4  --  9.3  MG 2.6* 2.6*  --   PHOS  --   --  2.7   Liver Function Tests: Recent Labs  Lab 10/01/24 1830 10/02/24 1218  AST 15  --   ALT 12  --   ALKPHOS 76  --   BILITOT 0.7  --   PROT 7.3  --   ALBUMIN 4.4 3.9   CBG: No results for input(s): GLUCAP in the last 168 hours.  Discharge time spent:  greater than 30 minutes.  Signed: AIDA CHO, MD [facility] 10/02/2024

## 2024-10-02 NOTE — ED Notes (Signed)
 Pt ambulatory to bathroom with minimal assist with steady gait.

## 2024-10-03 LAB — LEVETIRACETAM LEVEL: Levetiracetam Lvl: 113.2 ug/mL — ABNORMAL HIGH (ref 10.0–40.0)

## 2024-11-01 ENCOUNTER — Other Ambulatory Visit: Payer: Self-pay

## 2024-11-01 ENCOUNTER — Encounter: Payer: Self-pay | Admitting: Intensive Care

## 2024-11-01 ENCOUNTER — Emergency Department: Admission: EM | Admit: 2024-11-01 | Discharge: 2024-11-01 | Disposition: A | Discharge status: Home / Self Care

## 2024-11-01 ENCOUNTER — Emergency Department

## 2024-11-01 DIAGNOSIS — G40909 Epilepsy, unspecified, not intractable, without status epilepticus: Secondary | ICD-10-CM | POA: Diagnosis not present

## 2024-11-01 DIAGNOSIS — R4781 Slurred speech: Secondary | ICD-10-CM | POA: Diagnosis present

## 2024-11-01 DIAGNOSIS — R4182 Altered mental status, unspecified: Secondary | ICD-10-CM | POA: Diagnosis not present

## 2024-11-01 DIAGNOSIS — G9389 Other specified disorders of brain: Secondary | ICD-10-CM | POA: Insufficient documentation

## 2024-11-01 DIAGNOSIS — R55 Syncope and collapse: Secondary | ICD-10-CM | POA: Diagnosis not present

## 2024-11-01 DIAGNOSIS — I672 Cerebral atherosclerosis: Secondary | ICD-10-CM | POA: Diagnosis not present

## 2024-11-01 HISTORY — DX: Type 2 diabetes mellitus without complications: E11.9

## 2024-11-01 LAB — CBC
HCT: 42.1 % (ref 39.0–52.0)
Hemoglobin: 13.8 g/dL (ref 13.0–17.0)
MCH: 32 pg (ref 26.0–34.0)
MCHC: 32.8 g/dL (ref 30.0–36.0)
MCV: 97.7 fL (ref 80.0–100.0)
Platelets: 131 K/uL — ABNORMAL LOW (ref 150–400)
RBC: 4.31 MIL/uL (ref 4.22–5.81)
RDW: 13 % (ref 11.5–15.5)
WBC: 6.8 K/uL (ref 4.0–10.5)
nRBC: 0 % (ref 0.0–0.2)

## 2024-11-01 LAB — PROTIME-INR
INR: 1 (ref 0.8–1.2)
Prothrombin Time: 14.1 s (ref 11.4–15.2)

## 2024-11-01 LAB — COMPREHENSIVE METABOLIC PANEL WITH GFR
ALT: 16 U/L (ref 0–44)
AST: 18 U/L (ref 15–41)
Albumin: 4.6 g/dL (ref 3.5–5.0)
Alkaline Phosphatase: 83 U/L (ref 38–126)
Anion gap: 9 (ref 5–15)
BUN: 14 mg/dL (ref 8–23)
CO2: 28 mmol/L (ref 22–32)
Calcium: 9.6 mg/dL (ref 8.9–10.3)
Chloride: 104 mmol/L (ref 98–111)
Creatinine, Ser: 1.29 mg/dL — ABNORMAL HIGH (ref 0.61–1.24)
GFR, Estimated: 60 mL/min — ABNORMAL LOW (ref >60)
Glucose, Bld: 122 mg/dL — ABNORMAL HIGH (ref 70–99)
Potassium: 3.8 mmol/L (ref 3.5–5.1)
Sodium: 142 mmol/L (ref 135–145)
Total Bilirubin: 0.5 mg/dL (ref 0.0–1.2)
Total Protein: 6.9 g/dL (ref 6.5–8.1)

## 2024-11-01 LAB — DIFFERENTIAL
Abs Immature Granulocytes: 0.02 K/uL (ref 0.00–0.07)
Basophils Absolute: 0 K/uL (ref 0.0–0.1)
Basophils Relative: 0 %
Eosinophils Absolute: 0 K/uL (ref 0.0–0.5)
Eosinophils Relative: 1 %
Immature Granulocytes: 0 %
Lymphocytes Relative: 28 %
Lymphs Abs: 1.9 K/uL (ref 0.7–4.0)
Monocytes Absolute: 0.6 K/uL (ref 0.1–1.0)
Monocytes Relative: 9 %
Neutro Abs: 4.2 K/uL (ref 1.7–7.7)
Neutrophils Relative %: 62 %

## 2024-11-01 LAB — APTT: aPTT: 31 s (ref 24–36)

## 2024-11-01 LAB — ETHANOL: Alcohol, Ethyl (B): <15 mg/dL (ref <15)

## 2024-11-01 NOTE — ED Notes (Signed)
 Patient transported to CT

## 2024-11-01 NOTE — ED Provider Notes (Signed)
 [facility] Provider Note    Event Date/Time   First MD Initiated Contact with Patient 11/01/24 1117     (approximate)   History   Altered Mental Status   HPI  Steven Ortiz is a 68 y.o. male who presents with concern of altered mental status.  Apparently was being driven to church with one of his friends, while and route, the patient had brief development of word slurring.  Seems to have lasted less than 5 minutes at the time, and now completely resolved.  He has no complaints and feels back to baseline.  Denies history of strokes.  He does have a history of epilepsy for which he does take seizure medications and he is scheduled to follow-up with his neurologist in about 2 weeks.      Physical Exam   Triage Vital Signs: ED Triage Vitals [11/01/24 1048]  Encounter Vitals Group     BP (!) 148/85     Girls Systolic BP Percentile      Girls Diastolic BP Percentile      Boys Systolic BP Percentile      Boys Diastolic BP Percentile      Pulse Rate 98     Resp 18     Temp 98.1 F (36.7 C)     Temp Source Oral     SpO2 99 %     Weight 159 lb (72.1 kg)     Height 5' 9 (1.753 m)     Head Circumference      Peak Flow      Pain Score 0     Pain Loc      Pain Education      Exclude from Growth Chart     Most recent vital signs: Vitals:   11/01/24 1048  BP: (!) 148/85  Pulse: 98  Resp: 18  Temp: 98.1 F (36.7 C)  SpO2: 99%     General: Awake, no distress.  CV:  Good peripheral perfusion.  Resp:  Normal effort.  Abd:  No distention.  Neuro:  Cranial nerves II through XII are intact, he is alert and oriented x 3, answering questions appropriately, appropriate strength sensation coordination in all extremities, NIH score of 0 Other:     ED Results / Procedures / Treatments   Labs (all labs ordered are listed, but only abnormal results are displayed) Labs Reviewed  CBC - Abnormal; Notable for the following components:      Result  Value   Platelets 131 (*)    All other components within normal limits  COMPREHENSIVE METABOLIC PANEL WITH GFR - Abnormal; Notable for the following components:   Glucose, Bld 122 (*)    Creatinine, Ser 1.29 (*)    GFR, Estimated 60 (*)    All other components within normal limits  PROTIME-INR  APTT  DIFFERENTIAL  ETHANOL     EKG     RADIOLOGY   PROCEDURES:  Critical Care performed: No  Procedures   MEDICATIONS ORDERED IN ED: Medications - No data to display   IMPRESSION / MDM / ASSESSMENT AND PLAN / ED COURSE  I reviewed the triage vital signs and the nursing notes.                               Patient's presentation is most consistent with acute complicated illness / injury requiring diagnostic workup.  68 year old pleasant male who presents today with concern  of brief episode of word slurring.  He appears well at this time his symptoms have resolved.  He has an ABCD score of 3, given the clinical exam considering possible TIA versus other possible cause of speech disturbance.  His imaging and labs here are reassuring.  I discussed with him I offered admission given his age and history, however he declined this and I believe this is reasonable.  I encouraged him to contact his neurologist for a more urgent follow-up.  As well as very strict return precautions should symptoms worsen.  He verbalized understanding and is agreeable with the plan.       FINAL CLINICAL IMPRESSION(S) / ED DIAGNOSES   Final diagnoses:  Slurred speech     Rx / DC Orders   ED Discharge Orders     None        Note:  This document was prepared using Dragon voice recognition software and may include unintentional dictation errors.   Fernand Rossie HERO, MD 11/01/24 [phone]

## 2024-11-01 NOTE — ED Triage Notes (Signed)
 Patient at baseline at this time. His friend reports he was having AMS and delayed speech that started around 10:15am this morning.  Patient states he is having no symptoms at this time and feels normal.

## 2024-11-01 NOTE — Discharge Instructions (Signed)
 You were seen today due to concern of difficulty with speech.  At this time fortunately her symptoms have improved however I would strongly encourage you to contact your neurology office to set up an appointment as soon as possible.  If you have any worsening of symptoms such as confusion, difficulty with speaking, weakness, or any other symptoms you find concerning please return to the emergency department immediately for further medical management.
